# Patient Record
Sex: Female | Born: 1990 | Hispanic: No | Marital: Married | Smoking: Never smoker
Health system: Southern US, Community
[De-identification: ages and names within clinical notes are randomized; demographics above are authoritative.]

## PROBLEM LIST (undated history)

## (undated) ENCOUNTER — Inpatient Hospital Stay (HOSPITAL_COMMUNITY): Payer: Self-pay

## (undated) DIAGNOSIS — E039 Hypothyroidism, unspecified: Secondary | ICD-10-CM

## (undated) HISTORY — PX: NO PAST SURGERIES: SHX2092

## (undated) HISTORY — DX: Hypothyroidism, unspecified: E03.9

---

## 2015-06-13 DIAGNOSIS — Z833 Family history of diabetes mellitus: Secondary | ICD-10-CM | POA: Insufficient documentation

## 2016-01-23 LAB — HM PAP SMEAR

## 2016-07-04 ENCOUNTER — Ambulatory Visit: Payer: Self-pay | Admitting: Family Medicine

## 2016-07-07 ENCOUNTER — Ambulatory Visit (INDEPENDENT_AMBULATORY_CARE_PROVIDER_SITE_OTHER): Payer: BLUE CROSS/BLUE SHIELD | Admitting: Family Medicine

## 2016-07-07 ENCOUNTER — Encounter: Payer: Self-pay | Admitting: Family Medicine

## 2016-07-07 VITALS — BP 104/70 | HR 82 | Temp 98.2°F | Ht 62.0 in | Wt 168.4 lb

## 2016-07-07 DIAGNOSIS — Z6830 Body mass index (BMI) 30.0-30.9, adult: Secondary | ICD-10-CM

## 2016-07-07 DIAGNOSIS — E669 Obesity, unspecified: Secondary | ICD-10-CM

## 2016-07-07 DIAGNOSIS — Z23 Encounter for immunization: Secondary | ICD-10-CM

## 2016-07-07 DIAGNOSIS — Z8639 Personal history of other endocrine, nutritional and metabolic disease: Secondary | ICD-10-CM | POA: Diagnosis not present

## 2016-07-07 DIAGNOSIS — N939 Abnormal uterine and vaginal bleeding, unspecified: Secondary | ICD-10-CM | POA: Diagnosis not present

## 2016-07-07 LAB — COMPREHENSIVE METABOLIC PANEL
ALT: 37 U/L — ABNORMAL HIGH (ref 0–35)
AST: 29 U/L (ref 0–37)
Albumin: 4.1 g/dL (ref 3.5–5.2)
Alkaline Phosphatase: 116 U/L (ref 39–117)
BUN: 11 mg/dL (ref 6–23)
CHLORIDE: 105 meq/L (ref 96–112)
CO2: 25 meq/L (ref 19–32)
Calcium: 9.9 mg/dL (ref 8.4–10.5)
Creatinine, Ser: 0.58 mg/dL (ref 0.40–1.20)
GFR: 133.07 mL/min (ref >60.00)
GLUCOSE: 88 mg/dL (ref 70–99)
POTASSIUM: 3.9 meq/L (ref 3.5–5.1)
Sodium: 137 mEq/L (ref 135–145)
Total Bilirubin: 0.6 mg/dL (ref 0.2–1.2)
Total Protein: 8.5 g/dL — ABNORMAL HIGH (ref 6.0–8.3)

## 2016-07-07 LAB — TSH: TSH: 23.33 u[IU]/mL — ABNORMAL HIGH (ref 0.35–4.50)

## 2016-07-07 LAB — T4, FREE: Free T4: 0.43 ng/dL — ABNORMAL LOW (ref 0.60–1.60)

## 2016-07-07 LAB — HEMOGLOBIN A1C: Hgb A1c MFr Bld: 5.3 % (ref 4.6–6.5)

## 2016-07-07 NOTE — Patient Instructions (Addendum)
We will contact you regarding your lab work in the next 1-2 days.

## 2016-07-07 NOTE — Progress Notes (Signed)
Chief Complaint  Patient presents with  . Establish Care    check tsh       New Patient Visit SUBJECTIVE: HPI: Amy Wagner is an 26 y.o.female who is being seen for establishing care.   Pt moved from [place]. I see her husband.  Had child in 12/2015, was apparently on thyroid medication during pregnancy. Did not continue to take it. Has gained around 20 lbs. No fatigue, skin changes, swelling, hyper/hypodefecation, palpitations, masses in neck, heat/cold intolerance or urinary complaints. She does admit to spotting over the past 25 days. This is not typical for her.   No Known Allergies  Past Medical History:  Diagnosis Date  . No known health problems    Past Surgical History:  Procedure Laterality Date  . NO PAST SURGERIES     Social History   Social History  . Marital status: Married   Social History Main Topics  . Smoking status: Never Smoker  . Smokeless tobacco: Never Used  . Alcohol use No  . Drug use: No   History reviewed. No pertinent family history.   Current Outpatient Prescriptions:  .  Prenatal Multivit-Min-Fe-FA (PRENATAL 1 + IRON PO), Take 1 tablet by mouth daily., Disp: , Rfl:   Patient's last menstrual period was 06/16/2016.  ROS Cardiovascular: Denies palpitations  GI: Denies diarrhea   OBJECTIVE: BP 104/70 (BP Location: Left Arm, Patient Position: Sitting, Cuff Size: Small)   Pulse 82   Temp 98.2 F (36.8 C) (Oral)   Ht 5\' 2"  (1.575 m)   Wt 168 lb 6.4 oz (76.4 kg)   LMP 06/16/2016   SpO2 98%   BMI 30.80 kg/m   Constitutional: -  VS reviewed -  Well developed, well nourished, appears stated age -  No apparent distress  Psychiatric: -  Oriented to person, place, and time -  Memory intact -  Affect and mood normal -  Fluent conversation, good eye contact -  Judgment and insight age appropriate  Eye: -  Conjunctivae clear, no discharge -  Pupils symmetric, round, reactive to light  ENMT: -  Ears are patent b/l without erythema or  discharge. TM's are shiny and clear b/l without evidence of effusion or infection. -  Oral mucosa without lesions, tongue and uvula midline    Tonsils not enlarged, no erythema, no exudate, trachea midline    Pharynx moist, no lesions, no erythema  Neck: -  No gross swelling, no palpable masses -  Thyroid midline, not enlarged, mobile, no palpable masses  Cardiovascular: -  RRR, no murmurs -  No LE edema  Respiratory: -  Normal respiratory effort, no accessory muscle use, no retraction -  Breath sounds equal, no wheezes, no ronchi, no crackles  Gastrointestinal: -  Bowel sounds normal -  No tenderness, no distention, no guarding, no masses  Musculoskeletal: -  No clubbing, no cyanosis -  Gait normal  Skin: -  No significant lesion on inspection -  Warm and dry to palpation   ASSESSMENT/PLAN: History of hypothyroidism - Plan: TSH, T4, free  Abnormal uterine bleeding (AUB) - Plan: TSH  Class 1 obesity without serious comorbidity with body mass index (BMI) of 30.0 to 30.9 in adult, unspecified obesity type - Plan: Hemoglobin A1c, Comprehensive metabolic panel  Encounter for immunization - Plan: Flu Vaccine QUAD 36+ mos IM  Patient instructed to sign release of records form from her previous PCP. For GYN issue, will make sure it is not thyroid related. If it is not, will  give her the option for dedicated visit with us to discuss this issue, or f/u with her GYN for this. Patient should return pending the above. The patient voiced understanding and agreement to the plan.   Jilda Rocheicholas Paul [place], DO 07/07/16  11:39 AM

## 2016-07-07 NOTE — Progress Notes (Signed)
Pre visit review using our clinic review tool, if applicable. No additional management support is needed unless otherwise documented below in the visit note. 

## 2016-07-08 ENCOUNTER — Telehealth: Payer: Self-pay | Admitting: Family Medicine

## 2016-07-08 MED ORDER — LEVOTHYROXINE SODIUM 25 MCG PO CAPS: 1.0000 | ORAL_CAPSULE | Freq: Every day | ORAL | 1 refills | Status: DC

## 2016-07-08 MED ORDER — LEVOTHYROXINE SODIUM 25 MCG PO TABS: 25.0000 ug | ORAL_TABLET | Freq: Every day | ORAL | 1 refills | Status: DC

## 2016-07-08 NOTE — Telephone Encounter (Signed)
Rx for levothyroxine tablets has been sent to pharmacy. TL/CMA

## 2016-07-08 NOTE — Addendum Note (Signed)
Addended by: Kandace BlitzLONG, Jorel Gravlin M on: 07/08/2016 07:58 AM   Modules accepted: Orders

## 2016-07-08 NOTE — Telephone Encounter (Signed)
Pharmacy called in to see if they are able to switch pt Rx from capsules to tablets due to insurance? She says that her medicare will not pay for capsules   CB: [phone]- Merla RichesMary Grace

## 2016-07-10 ENCOUNTER — Encounter: Payer: Self-pay | Admitting: Family Medicine

## 2016-07-11 NOTE — Telephone Encounter (Signed)
I am not sure how to release her labs so she can see it via MyChart...namely her TSH and Free T4. TY.

## 2016-08-20 ENCOUNTER — Ambulatory Visit: Payer: BLUE CROSS/BLUE SHIELD | Admitting: Family Medicine

## 2016-09-03 ENCOUNTER — Other Ambulatory Visit: Payer: Self-pay | Admitting: Family Medicine

## 2016-09-08 NOTE — Telephone Encounter (Signed)
Rx faxed to the pharmacy.  However only given #30.  The patient needs further evaluation and/or laboratory testing before further refills are given.  Asked pharmacy to inform the patient they will need to make an appointment for this.   Confirmation received.//AB/CMA

## 2016-09-22 ENCOUNTER — Ambulatory Visit (INDEPENDENT_AMBULATORY_CARE_PROVIDER_SITE_OTHER): Payer: Self-pay | Admitting: Family Medicine

## 2016-09-22 ENCOUNTER — Encounter: Payer: Self-pay | Admitting: Family Medicine

## 2016-09-22 VITALS — BP 102/62 | HR 84 | Temp 97.7°F | Ht 62.0 in | Wt 163.2 lb

## 2016-09-22 DIAGNOSIS — E039 Hypothyroidism, unspecified: Secondary | ICD-10-CM

## 2016-09-22 DIAGNOSIS — R740 Nonspecific elevation of levels of transaminase and lactic acid dehydrogenase [LDH]: Secondary | ICD-10-CM

## 2016-09-22 DIAGNOSIS — R7401 Elevation of levels of liver transaminase levels: Secondary | ICD-10-CM

## 2016-09-22 DIAGNOSIS — N939 Abnormal uterine and vaginal bleeding, unspecified: Secondary | ICD-10-CM

## 2016-09-22 LAB — TSH: TSH: 13.43 u[IU]/mL — ABNORMAL HIGH (ref 0.35–4.50)

## 2016-09-22 NOTE — Progress Notes (Signed)
Pre visit review using our clinic review tool, if applicable. No additional management support is needed unless otherwise documented below in the visit note. 

## 2016-09-22 NOTE — Patient Instructions (Signed)
Call me when your insurance kicks in.  If things look good, I will see you in 3 mo.  Aleve is available over the counter. Take 2 tabs twice daily. If things with cycle do not improve, call your GYN. This may be related to thyroid also.

## 2016-09-22 NOTE — Progress Notes (Signed)
Chief Complaint  Patient presents with  . Follow-up    on thyroid    Subjective: Here with husband, Dayna BarkerRamesh. They did not f/u as initially recommended due to loss of insurance. Hypothyroidism Patient presents for follow-up of hypothyroidism.  Reports compliance with medication- recently started on levothyroxine 25 mcg daily. Current symptoms include: weight loss - lost 5 lbs Denies: fatigue, weight gain, feeling cold and cold intolerance, constipation, swelling, losing hair, anxiousness, palpitations, voice changes, ST, trouble swallowing, and sweating She believes her dose should be unchanged  Pt also had elevated ALT on initial labs. She takes no medications routinely.  Vaginal bleeding Over the past 2-3 days, the patient has had heavier vaginal bleeding without pain. She is passing some clots. She is compliant with Thyroid medication. No urinary complaints, fevers, abdominal pain, or reports of injury. She has not let her GYN know of her issues yet.  ROS: Heart: Denies palpitations GU: as noted in HPI   Family History  Problem Relation Age of Onset  . Cancer Neg Hx    Past Medical History:  Diagnosis Date  . No known health problems    No Known Allergies  Current Outpatient Prescriptions:  .  levothyroxine (SYNTHROID, LEVOTHROID) 25 MCG tablet, TAKE ONE TABLET BY MOUTH ONCE DAILY BEFORE BREAKFAST, Disp: 30 tablet, Rfl: 0 .  Prenatal Multivit-Min-Fe-FA (PRENATAL 1 + IRON PO), Take 1 tablet by mouth daily., Disp: , Rfl:   Objective: BP 102/62 (BP Location: Right Arm, Patient Position: Sitting, Cuff Size: Normal)   Pulse 84   Temp 97.7 F (36.5 C) (Oral)   Ht 5\' 2"  (1.575 m)   Wt 163 lb 3.2 oz (74 kg)   LMP 08/25/2016 (Approximate)   SpO2 99%   BMI 29.85 kg/m  General: Awake, appears stated age Neck: +thyromegaly,  Skin: No lesions on exposed skin surfaces Heart: RRR, no murmurs, no LE edema Lungs: CTAB, no rales, wheezes or rhonchi. No accessory muscle use Abd:  Soft, NT, ND, no masses or organomegaly Neuro: 2/4 patellar reflex b/l, no clonus, no cerebellar signs Psych: Age appropriate judgment and insight, normal affect and mood  Assessment and Plan: Hypothyroidism, unspecified type - Plan: TSH, CANCELED: T4, free  Elevated ALT measurement - Plan: CANCELED: Ferritin, CANCELED: IBC panel, CANCELED: Hepatitis C antibody, CANCELED: CBC, CANCELED: Hepatitis B Surface AntiGEN  Abnormal uterine bleeding (AUB)  Orders as above. Keep on same dose for now. Will hold off on cancelled labs until insurance kicks in. Aleve for bleeding, if worsening and thyroid WNL, should contact GYN if no improvement. F/u in 3 mo pending above. The patient voiced understanding and agreement to the plan.  Jilda Rocheicholas Paul [place], DO 09/22/16  10:42 AM

## 2016-09-23 ENCOUNTER — Other Ambulatory Visit: Payer: Self-pay | Admitting: Family Medicine

## 2016-09-23 MED ORDER — LEVOTHYROXINE SODIUM 25 MCG PO TABS: 37.5000 ug | ORAL_TABLET | Freq: Every day | ORAL | 1 refills | Status: DC

## 2016-09-29 ENCOUNTER — Other Ambulatory Visit: Payer: Self-pay | Admitting: Family Medicine

## 2016-09-30 ENCOUNTER — Encounter: Payer: Self-pay | Admitting: Family Medicine

## 2016-09-30 MED ORDER — LEVOTHYROXINE SODIUM 25 MCG PO TABS: 25.0000 ug | ORAL_TABLET | Freq: Every day | ORAL | 0 refills | Status: DC

## 2016-12-02 ENCOUNTER — Encounter: Payer: Self-pay | Admitting: Family Medicine

## 2016-12-04 ENCOUNTER — Encounter: Payer: Self-pay | Admitting: Family Medicine

## 2016-12-04 MED ORDER — LEVOTHYROXINE SODIUM 25 MCG PO TABS: 25.0000 ug | ORAL_TABLET | Freq: Every day | ORAL | 0 refills | Status: DC

## 2016-12-24 ENCOUNTER — Ambulatory Visit: Payer: Self-pay | Admitting: Family Medicine

## 2017-01-19 ENCOUNTER — Encounter: Payer: Self-pay | Admitting: Family Medicine

## 2017-01-19 ENCOUNTER — Ambulatory Visit (INDEPENDENT_AMBULATORY_CARE_PROVIDER_SITE_OTHER): Payer: Self-pay | Admitting: Family Medicine

## 2017-01-19 ENCOUNTER — Other Ambulatory Visit: Payer: Self-pay | Admitting: Family Medicine

## 2017-01-19 VITALS — BP 100/60 | HR 69 | Temp 98.5°F | Ht 62.0 in | Wt 161.2 lb

## 2017-01-19 DIAGNOSIS — N926 Irregular menstruation, unspecified: Secondary | ICD-10-CM

## 2017-01-19 DIAGNOSIS — E039 Hypothyroidism, unspecified: Secondary | ICD-10-CM

## 2017-01-19 DIAGNOSIS — Z3201 Encounter for pregnancy test, result positive: Secondary | ICD-10-CM

## 2017-01-19 LAB — T4, FREE: Free T4: 0.76 ng/dL (ref 0.60–1.60)

## 2017-01-19 LAB — POCT URINE PREGNANCY: Preg Test, Ur: POSITIVE — AB

## 2017-01-19 LAB — TSH: TSH: 4.51 u[IU]/mL — AB (ref 0.35–4.50)

## 2017-01-19 MED ORDER — LEVOTHYROXINE SODIUM 25 MCG PO TABS: ORAL_TABLET | ORAL | 1 refills | Status: DC

## 2017-01-19 NOTE — Patient Instructions (Signed)
I will let you know exactly what to do on MyChart based on your labs.  We will refer you to an OB that accepts your insurance.  Start taking a pre-natal vitamin.

## 2017-01-19 NOTE — Progress Notes (Signed)
Synthroid refilled.

## 2017-01-19 NOTE — Progress Notes (Signed)
Chief Complaint  Patient presents with  . Follow-up    on thyroid    Subjective: Hypothyroidism Patient presents for follow-up of hypothyroidism.  Reports compliance with medication. Current symptoms include: none Denies: fatigue, weight changes, feeling cold and cold intolerance, constipation, swelling, feeling slow, losing hair, anxiousness, feeling excessive energy, palpitations and sweating She believes her dose should be unchanged pending her labs  ROS: Heart: Denies chest pain  Lungs: Denies SOB   Family History  Problem Relation Age of Onset  . Cancer Neg Hx    Past Medical History:  Diagnosis Date  . No known health problems    No Known Allergies  Current Outpatient Prescriptions:  .  levothyroxine (SYNTHROID, LEVOTHROID) 25 MCG tablet, Take 1 tablet (25 mcg total) by mouth daily. (Patient taking differently: Take 1 1/2 tablet by mouth daily.), Disp: 90 tablet, Rfl: 0 .  Prenatal Multivit-Min-Fe-FA (PRENATAL 1 + IRON PO), Take 1 tablet by mouth daily., Disp: , Rfl:   Objective: BP 100/60 (BP Location: Left Arm, Patient Position: Sitting, Cuff Size: Normal)   Pulse 69   Temp 98.5 F (36.9 C) (Oral)   Ht 5\' 2"  (1.575 m)   Wt 161 lb 3.2 oz (73.1 kg)   LMP 11/28/2016 (Approximate) Comment: Pt really not sure of the date  SpO2 98%   BMI 29.48 kg/m  General: Awake, appears stated age HEENT: MMM, EOMi Heart: RRR, no murmurs, no LE edema Lungs: CTAB, no rales, wheezes or rhonchi. No accessory muscle use Abd: BS+, soft, NT, ND , no masses or organomegaly Neck: Supple, symmetric, no nodules on thyroid or thyromegaly Psych: Age appropriate judgment and insight, normal affect and mood  Assessment and Plan: Hypothyroidism, unspecified type - Plan: TSH, T4, free  Missed period - Plan: POCT urine pregnancy  Positive pregnancy test - Plan: Ambulatory referral to Obstetrics / Gynecology  Orders as above. She has been difficult to monitor due to poor follow-up  secondary to no insurance coverage. She is improving, however I will order a free T4 and TSH. Continue on current dose until I get the results back. She had a home positive pregnancy test. Verified today. Will refer to OB. The patient voiced understanding and agreement to the plan.  Jilda Rocheicholas Paul [place], DO 01/19/17  12:00 PM

## 2017-01-20 ENCOUNTER — Encounter: Payer: Self-pay | Admitting: Family Medicine

## 2017-01-29 ENCOUNTER — Encounter: Payer: Self-pay | Admitting: Family Medicine

## 2017-02-24 ENCOUNTER — Encounter: Payer: Self-pay | Admitting: Certified Nurse Midwife

## 2017-02-26 ENCOUNTER — Other Ambulatory Visit (HOSPITAL_COMMUNITY)
Admission: RE | Admit: 2017-02-26 | Discharge: 2017-02-26 | Disposition: A | Discharge status: Home / Self Care | Payer: Medicaid Other | Source: Ambulatory Visit | Attending: Student | Admitting: Student

## 2017-02-26 ENCOUNTER — Encounter: Payer: Self-pay | Admitting: Student

## 2017-02-26 ENCOUNTER — Ambulatory Visit (INDEPENDENT_AMBULATORY_CARE_PROVIDER_SITE_OTHER): Payer: Medicaid Other | Admitting: Student

## 2017-02-26 DIAGNOSIS — Z3481 Encounter for supervision of other normal pregnancy, first trimester: Secondary | ICD-10-CM

## 2017-02-26 DIAGNOSIS — Z3490 Encounter for supervision of normal pregnancy, unspecified, unspecified trimester: Secondary | ICD-10-CM | POA: Insufficient documentation

## 2017-02-26 DIAGNOSIS — E039 Hypothyroidism, unspecified: Secondary | ICD-10-CM

## 2017-02-26 MED ORDER — LEVOTHYROXINE SODIUM 25 MCG PO TABS: ORAL_TABLET | ORAL | 1 refills | Status: DC

## 2017-02-26 NOTE — Progress Notes (Signed)
  Subjective:    Amy DeedsSharmila Poudel is being seen today for her first obstetrical visit.  This is a planned pregnancy. She is at 4345w6d gestation. Her obstetrical history is significant for hypothyroidism. Relationship with FOB: spouse, living together. Patient does intend to breast feed. Pregnancy history fully reviewed.  Patient reports no complaints.  Review of Systems:   Review of Systems  Constitutional: Negative.   Respiratory: Negative.   Cardiovascular: Negative.   Gastrointestinal: Negative.   Genitourinary: Negative.   Musculoskeletal: Negative.   Psychiatric/Behavioral: Negative.     Objective:     BP 114/74   Pulse 93   Wt 156 lb (70.8 kg)   LMP 11/28/2016 (Approximate) Comment: Pt really not sure of the date  BMI 28.53 kg/m  Physical Exam  Constitutional: She is oriented to person, place, and time. She appears well-developed.  HENT:  Head: Normocephalic.  Neck: Normal range of motion.  Respiratory: Effort normal.  GI: Soft.  Musculoskeletal: Normal range of motion.  Neurological: She is alert and oriented to person, place, and time. She has normal reflexes.  Skin: Skin is warm and dry.  Psychiatric: She has a normal mood and affect.    Exam    Assessment:    Pregnancy: G2P1001 Patient Active Problem List   Diagnosis Date Noted  . Supervision of normal pregnancy in first trimester 02/26/2017  . Hypothyroid 09/22/2016       Plan:     Initial labs drawn. Prenatal vitamins. Problem list reviewed and updated. Role of ultrasound in pregnancy discussed; fetal survey: planning. Amniocentesis discussed: not indicated. Follow up in 4 weeks. 50% of 30 min visit spent on counseling and coordination of care.  -TSH today -Nuchal translucecy ordered today -continue synthroid 37.5 mcg  Charlesetta GaribaldiKathryn Lorraine Kooistra CNM 02/26/2017

## 2017-02-26 NOTE — Patient Instructions (Signed)

## 2017-02-26 NOTE — Addendum Note (Signed)
Addended by: Cheree DittoGRAHAM, Christinia Lambeth A on: 02/26/2017 03:15 PM   Modules accepted: Orders

## 2017-02-27 LAB — OBSTETRIC PANEL, INCLUDING HIV
Antibody Screen: NEGATIVE
BASOS ABS: 0 10*3/uL (ref 0.0–0.2)
BASOS: 0 %
EOS (ABSOLUTE): 0.1 10*3/uL (ref 0.0–0.4)
Eos: 1 %
HEMATOCRIT: 40.2 % (ref 34.0–46.6)
HIV Screen 4th Generation wRfx: NONREACTIVE
Hemoglobin: 13.3 g/dL (ref 11.1–15.9)
Hepatitis B Surface Ag: NEGATIVE
IMMATURE GRANS (ABS): 0 10*3/uL (ref 0.0–0.1)
IMMATURE GRANULOCYTES: 0 %
LYMPHS: 31 %
Lymphocytes Absolute: 4 10*3/uL — ABNORMAL HIGH (ref 0.7–3.1)
MCH: 28 pg (ref 26.6–33.0)
MCHC: 33.1 g/dL (ref 31.5–35.7)
MCV: 85 fL (ref 79–97)
MONOCYTES: 5 %
Monocytes Absolute: 0.6 10*3/uL (ref 0.1–0.9)
NEUTROS PCT: 63 %
Neutrophils Absolute: 8 10*3/uL — ABNORMAL HIGH (ref 1.4–7.0)
PLATELETS: 261 10*3/uL (ref 150–379)
RBC: 4.75 x10E6/uL (ref 3.77–5.28)
RDW: 15.2 % (ref 12.3–15.4)
RPR Ser Ql: NONREACTIVE
RUBELLA: 2.76 {index} (ref >0.99)
Rh Factor: POSITIVE
WBC: 12.7 10*3/uL — ABNORMAL HIGH (ref 3.4–10.8)

## 2017-02-27 LAB — TSH: TSH: 2.02 u[IU]/mL (ref 0.450–4.500)

## 2017-02-27 LAB — GC/CHLAMYDIA PROBE AMP (~~LOC~~) NOT AT ARMC
CHLAMYDIA, DNA PROBE: NEGATIVE
NEISSERIA GONORRHEA: NEGATIVE

## 2017-02-28 LAB — URINE CULTURE

## 2017-03-02 ENCOUNTER — Encounter: Payer: Self-pay | Admitting: Student

## 2017-03-03 ENCOUNTER — Encounter: Payer: Self-pay | Admitting: General Practice

## 2017-03-03 ENCOUNTER — Encounter: Payer: Self-pay | Admitting: Student

## 2017-03-03 LAB — HEMOGLOBINOPATHY EVALUATION
Ferritin: 46 ng/mL (ref 15–150)
HEMOGLOBIN: 13.2 g/dL (ref 11.1–15.9)
Hematocrit: 39.5 % (ref 34.0–46.6)
Hgb A2 Quant: 2.3 % (ref 1.8–3.2)
Hgb A: 97.7 % (ref 96.4–98.8)
Hgb C: 0 %
Hgb F Quant: 0 % (ref 0.0–2.0)
Hgb S: 0 %
Hgb Solubility: NEGATIVE
Hgb Variant: 0 %
MCH: 28.4 pg (ref 26.6–33.0)
MCHC: 33.4 g/dL (ref 31.5–35.7)
MCV: 85 fL (ref 79–97)
Platelets: 262 10*3/uL (ref 150–379)
RBC: 4.65 x10E6/uL (ref 3.77–5.28)
RDW: 14.6 % (ref 12.3–15.4)
WBC: 13.5 10*3/uL — ABNORMAL HIGH (ref 3.4–10.8)

## 2017-03-05 ENCOUNTER — Other Ambulatory Visit: Payer: Self-pay | Admitting: Student

## 2017-03-21 LAB — SPECIMEN STATUS REPORT

## 2017-03-21 LAB — HGB A1C W/O EAG: Hgb A1c MFr Bld: 5.1 % (ref 4.8–5.6)

## 2017-03-26 ENCOUNTER — Ambulatory Visit (INDEPENDENT_AMBULATORY_CARE_PROVIDER_SITE_OTHER): Payer: Medicaid Other | Admitting: Certified Nurse Midwife

## 2017-03-26 VITALS — BP 121/87 | HR 112 | Wt 157.0 lb

## 2017-03-26 DIAGNOSIS — O99282 Endocrine, nutritional and metabolic diseases complicating pregnancy, second trimester: Secondary | ICD-10-CM | POA: Diagnosis present

## 2017-03-26 DIAGNOSIS — Z23 Encounter for immunization: Secondary | ICD-10-CM

## 2017-03-26 DIAGNOSIS — E039 Hypothyroidism, unspecified: Secondary | ICD-10-CM

## 2017-03-26 DIAGNOSIS — Z3481 Encounter for supervision of other normal pregnancy, first trimester: Secondary | ICD-10-CM

## 2017-03-26 DIAGNOSIS — N644 Mastodynia: Secondary | ICD-10-CM

## 2017-03-26 NOTE — Progress Notes (Signed)
Pt has sore area to right breast

## 2017-03-26 NOTE — Patient Instructions (Signed)

## 2017-03-26 NOTE — Progress Notes (Signed)
Subjective:  Amy Wagner is a 26 y.o. G2P1001 at [redacted]w[redacted]d being seen today for ongoing prenatal care.  She is currently monitored for the following issues for this low-risk pregnancy and has Hypothyroid and Supervision of normal pregnancy in first trimester on her problem list.  Patient reports Pain in right nipple. Still breastfeeding her toddler. No lump or redness..   Lockie Pares. Bleeding: None.  Movement: Absent. Denies leaking of fluid.   The following portions of the patient's history were reviewed and updated as appropriate: allergies, current medications, past family history, past medical history, past social history, past surgical history and problem list. Problem list updated.  Objective:   Vitals:   03/26/17 0953  BP: 121/87  Pulse: (!) 112  Weight: 157 lb (71.2 kg)    Fetal Status: Fetal Heart Rate (bpm): 148 Fundal Height: 16 cm Movement: Absent     General:  Alert, oriented and cooperative. Patient is in no acute distress.  Skin: Skin is warm and dry. No rash noted.   Cardiovascular: Normal heart rate noted  Respiratory: Normal respiratory effort, no problems with respiration noted  Abdomen: Soft, gravid, appropriate for gestational age. Pain/Pressure: Absent     Pelvic: Vag. Bleeding: None     Cervical exam deferred        Extremities: Normal range of motion.  Edema: None  Mental Status: Normal mood and affect. Normal behavior. Normal judgment and thought content.  Breasts: left breast normal without mass, skin or nipple changes. Right breast normal. Right nipple: no edema, erythema, or drainage. Small area at 12 o'clock cracked.  Urinalysis:      Assessment and Plan:  Pregnancy: G2P1001 at [redacted]w[redacted]d  1. Hypothyroidism affecting pregnancy in second trimester - TSH  2. Encounter for supervision of other normal pregnancy in first trimester - Culture, OB Urine - [place] MFM OB COMP + 14 WK; Future  3. Need for immunization against influenza - Flu Vaccine QUAD 6+ mos IM  (Fluarix) - AFP/Quad Scr  4. Nipple pain - no sign of infection or yeast - likely caused by trauma - recommend warm compresses, coconut oil, and no nursing on that side  Preterm labor symptoms and general obstetric precautions including but not limited to vaginal bleeding, contractions, leaking of fluid and fetal movement were reviewed in detail with the patient. Please refer to After Visit Summary for other counseling recommendations.  Return in about 4 weeks (around 04/23/2017).   Donette Larry, CNM

## 2017-03-27 LAB — AFP TUMOR MARKER: AFP, SERUM, TUMOR MARKER: 17.7 ng/mL — AB (ref 0.0–8.3)

## 2017-03-27 LAB — TSH: TSH: 3.13 u[IU]/mL (ref 0.450–4.500)

## 2017-03-28 LAB — URINE CULTURE, OB REFLEX

## 2017-03-28 LAB — CULTURE, OB URINE

## 2017-04-01 ENCOUNTER — Encounter (HOSPITAL_COMMUNITY): Payer: Self-pay | Admitting: Certified Nurse Midwife

## 2017-04-02 ENCOUNTER — Telehealth: Payer: Self-pay | Admitting: Certified Nurse Midwife

## 2017-04-02 NOTE — Telephone Encounter (Signed)
Patient husband called he have questions about his wife test results

## 2017-04-07 NOTE — Telephone Encounter (Signed)
Spoke with patient's husband. He was concerned about elevated AFP level. Upon checking on result, noted that the incorrect lab was ordered. Explained this to him and told him that this test could be repeated at her next visit on 10/26. Understanding voiced.

## 2017-04-08 ENCOUNTER — Inpatient Hospital Stay (HOSPITAL_COMMUNITY)
Admission: AD | Admit: 2017-04-08 | Discharge: 2017-04-08 | Disposition: A | Discharge status: Home / Self Care | Payer: Medicaid Other | Source: Ambulatory Visit | Attending: Family Medicine | Admitting: Family Medicine

## 2017-04-08 DIAGNOSIS — X58XXXA Exposure to other specified factors, initial encounter: Secondary | ICD-10-CM | POA: Insufficient documentation

## 2017-04-08 DIAGNOSIS — Z3A18 18 weeks gestation of pregnancy: Secondary | ICD-10-CM | POA: Insufficient documentation

## 2017-04-08 DIAGNOSIS — S025XXA Fracture of tooth (traumatic), initial encounter for closed fracture: Secondary | ICD-10-CM | POA: Insufficient documentation

## 2017-04-08 DIAGNOSIS — O9A212 Injury, poisoning and certain other consequences of external causes complicating pregnancy, second trimester: Secondary | ICD-10-CM | POA: Diagnosis not present

## 2017-04-08 DIAGNOSIS — K0889 Other specified disorders of teeth and supporting structures: Secondary | ICD-10-CM

## 2017-04-08 DIAGNOSIS — Z331 Pregnant state, incidental: Secondary | ICD-10-CM | POA: Diagnosis not present

## 2017-04-08 MED ORDER — ACETAMINOPHEN 500 MG PO TABS
1000.0000 mg | ORAL_TABLET | Freq: Once | ORAL | Status: AC
  Administered 2017-04-08: 1000 mg via ORAL
  Filled 2017-04-08: qty 2

## 2017-04-08 NOTE — MAU Note (Signed)
Pt c/o severe tooth pain that started yesterday morning, but it got worse throughout the night. States she is unable to get into dentist. Pt states she has a broken tooth that broke several days ago.

## 2017-04-08 NOTE — Discharge Instructions (Signed)
Dentists in [facility] [place]  [address]  [phone]   [facility]  [address] [place]  [phone]   Elson Areas DDS  [address]  [phone]   Irving Burton DDS  [address]  [phone]   [facility]   [address]  [phone]   [facility]  [address] [place] [address]  [phone]  [facility]  [address]  [phone]   [facility]  [address] [place] [address]  [phone]   [facility]  [address]  [phone]   Druscilla Brownie A. Jon Billings, DDS PA  [address]  [phone]   [facility] F[facility]  [address]  [phone]   Dr. Merilyn Baba, DDS  [address]  [phone]   Talmage Coin DDS  [address] [place]  [phone]   Dr. Anne Ng, DDS  No reviews  Dentist  [address] [place]  [phone]   Dr. Meda Coffee. Arlyce Dice, DDS  [address]  [phone]   Dr. Georga Hacking. Lutins, DDS   Dentist  [address]  [phone]   Lorenza Burton I DDS  [address]  [phone]  Dr. Sherrlyn Hock  Dentist  [address] [place]  [phone]

## 2017-04-08 NOTE — MAU Provider Note (Signed)
  History     CSN: [id number]  Arrival date and time: 04/08/17 0455  HPI: Amy Wagner is a 26 y.o. G2P1001 with IUP at [redacted]w[redacted]d who presents with c/c of broken tooth. She reports that her tooth broke/chipped off about a week ago, this is causing her some pain, which is worse today. She has not taken anything at home for the pain. She denies any fever, chills, N/V, or any other symptoms. Denies any pregnancy problems, vaginal bleeding, vaginal discharge, or urinary symptoms.  Past obstetric history: OB History  Gravida Para Term Preterm AB Living  SAB TAB Ectopic Multiple Live Births          1    # Outcome Date GA Lbr Len/2nd Weight Sex Delivery Anes PTL Lv  2 Current           1 Term 01/25/16   6 lb (2.722 kg) F Vag-Spont  N LIV      Past Medical History:  Diagnosis Date  . No known health problems     Past Surgical History:  Procedure Laterality Date  . NO PAST SURGERIES      Family History  Problem Relation Age of Onset  . Diabetes Mother   . Hypertension Mother   . Cancer Neg Hx     Social History  Substance Use Topics  . Smoking status: Never Smoker  . Smokeless tobacco: Never Used  . Alcohol use No    Allergies: No Known Allergies  Prescriptions Prior to Admission  Medication Sig Dispense Refill Last Dose  . levothyroxine (SYNTHROID, LEVOTHROID) 25 MCG tablet Take 1 1/2 tablet by mouth daily. 135 tablet 1 Taking  . Prenatal Multivit-Min-Fe-FA (PRENATAL 1 + IRON PO) Take 1 tablet by mouth daily.   Taking    Review of Systems - Negative except for what is mentioned in HPI.  Physical Exam   Blood pressure 112/71, pulse 79, temperature 97.8 F (36.6 C), resp. rate 16, height  (1.575 m), weight 164 lb (74.4 kg), last menstrual period 11/28/2016, SpO2 100 %.  Constitutional: Well-developed, well-nourished female in no acute distress.  HENT: [place]/AT, normal oropharynx mucosa. R molar tooth noted to be broke at top; no drainage; no gingival  erythema or swelling. No TTP.  Eyes: normal conjunctivae, no scleral icterus Cardiovascular: normal rate Respiratory: normal effort MSK: no edema, normal ROM Neurologic: Alert and oriented x 4. Psych: Normal mood and affect Skin: warm and dry   FHT:  156  MAU Course  Procedures  MDM Pt seen and examined. VS reviewed.  Tylenol 1,000 gram given with some relief. Patient does not want anything stronger for pain and had not tried prior treatment at home. No signs or symptoms of infection. Recommended follow up with dentist as early as today.   Assessment and Plan  Assessment: 1. Closed fracture of tooth, initial encounter   2. Tooth pain     Plan: --Discharge home in stable condition with follow up with dentist --List of dentists in the area given --Letter for dental care given.     Degele, Kandra Nicolas, MD 04/08/2017 5:51 AM

## 2017-04-09 ENCOUNTER — Encounter: Payer: Self-pay | Admitting: Advanced Practice Midwife

## 2017-04-09 ENCOUNTER — Ambulatory Visit (HOSPITAL_COMMUNITY)
Admission: RE | Admit: 2017-04-09 | Discharge: 2017-04-09 | Disposition: A | Discharge status: Home / Self Care | Payer: Medicaid Other | Source: Ambulatory Visit | Attending: Certified Nurse Midwife | Admitting: Certified Nurse Midwife

## 2017-04-09 ENCOUNTER — Other Ambulatory Visit: Payer: Self-pay | Admitting: Certified Nurse Midwife

## 2017-04-09 ENCOUNTER — Other Ambulatory Visit (HOSPITAL_COMMUNITY): Payer: Self-pay | Admitting: *Deleted

## 2017-04-09 DIAGNOSIS — O365920 Maternal care for other known or suspected poor fetal growth, second trimester, not applicable or unspecified: Secondary | ICD-10-CM

## 2017-04-09 DIAGNOSIS — E039 Hypothyroidism, unspecified: Secondary | ICD-10-CM

## 2017-04-09 DIAGNOSIS — Z3689 Encounter for other specified antenatal screening: Secondary | ICD-10-CM

## 2017-04-09 DIAGNOSIS — Z3A17 17 weeks gestation of pregnancy: Secondary | ICD-10-CM | POA: Diagnosis not present

## 2017-04-09 DIAGNOSIS — Z3481 Encounter for supervision of other normal pregnancy, first trimester: Secondary | ICD-10-CM

## 2017-04-09 DIAGNOSIS — O99282 Endocrine, nutritional and metabolic diseases complicating pregnancy, second trimester: Secondary | ICD-10-CM | POA: Insufficient documentation

## 2017-04-21 LAB — AFP TETRA
DIA Value (EIA): 172.77 pg/mL
DSR (By Age)    1 IN: 878
MSAFP: 13.5 ng/mL
MSHCG: 39637 m[IU]/mL
Maternal Age At EDD: 27.7 yr
uE3 Value: 0.5 ng/mL

## 2017-04-21 LAB — SPECIMEN STATUS REPORT

## 2017-04-24 ENCOUNTER — Ambulatory Visit (INDEPENDENT_AMBULATORY_CARE_PROVIDER_SITE_OTHER): Payer: Medicaid Other | Admitting: Advanced Practice Midwife

## 2017-04-24 VITALS — BP 124/75 | HR 98 | Wt 166.3 lb

## 2017-04-24 DIAGNOSIS — O162 Unspecified maternal hypertension, second trimester: Secondary | ICD-10-CM

## 2017-04-24 DIAGNOSIS — Z3400 Encounter for supervision of normal first pregnancy, unspecified trimester: Secondary | ICD-10-CM

## 2017-04-24 DIAGNOSIS — Z3402 Encounter for supervision of normal first pregnancy, second trimester: Secondary | ICD-10-CM

## 2017-04-24 MED ORDER — PRENATAL PLUS 27-1 MG PO TABS: 1.0000 | ORAL_TABLET | Freq: Every day | ORAL | 6 refills | Status: DC

## 2017-04-24 NOTE — Patient Instructions (Addendum)
Hypertension During Pregnancy Hypertension, commonly called high blood pressure, is when the force of blood pumping through your arteries is too strong. Arteries are blood vessels that carry blood from the heart throughout the body. Hypertension during pregnancy can cause problems for you and your baby. Your baby may be born early (prematurely) or may not weigh as much as he or she should at birth. Very bad cases of hypertension during pregnancy can be life-threatening. Different types of hypertension can occur during pregnancy. These include:  Chronic hypertension. This happens when: ? You have hypertension before pregnancy and it continues during pregnancy. ? You develop hypertension before you are [redacted] weeks pregnant, and it continues during pregnancy.  Gestational hypertension. This is hypertension that develops after the 20th week of pregnancy.  Preeclampsia, also called toxemia of pregnancy. This is a very serious type of hypertension that develops only during pregnancy. It affects the whole body, and it can be very dangerous for you and your baby.  Gestational hypertension and preeclampsia usually go away within 6 weeks after your baby is born. Women who have hypertension during pregnancy have a greater chance of developing hypertension later in life or during future pregnancies. What are the causes? The exact cause of hypertension is not known. What increases the risk? There are certain factors that make it more likely for you to develop hypertension during pregnancy. These include:  Having hypertension during a previous pregnancy or prior to pregnancy.  Being overweight.  Being older than age 107.  Being pregnant for the first time or being pregnant with more than one baby.  Becoming pregnant using fertilization methods such as IVF (in vitro fertilization).  Having diabetes, kidney problems, or systemic lupus erythematosus.  Having a family history of hypertension.  What are the  signs or symptoms? Chronic hypertension and gestational hypertension rarely cause symptoms. Preeclampsia causes symptoms, which may include:  Increased protein in your urine. Your health care provider will check for this at every visit before you give birth (prenatal visit).  Severe headaches.  Sudden weight gain.  Swelling of the hands, face, legs, and feet.  Nausea and vomiting.  Vision problems, such as blurred or double vision.  Numbness in the face, arms, legs, and feet.  Dizziness.  Slurred speech.  Sensitivity to bright lights.  Abdominal pain.  Convulsions.  How is this diagnosed? You may be diagnosed with hypertension during a routine prenatal exam. At each prenatal visit, you may:  Have a urine test to check for high amounts of protein in your urine.  Have your blood pressure checked. A blood pressure reading is recorded as two numbers, such as "120 over 80" (or 120/80). The first ("top") number is called the systolic pressure. It is a measure of the pressure in your arteries when your heart beats. The second ("bottom") number is called the diastolic pressure. It is a measure of the pressure in your arteries as your heart relaxes between beats. Blood pressure is measured in a unit called mm Hg. A normal blood pressure reading is: ? Systolic: below 235. ? Diastolic: below 80.  The type of hypertension that you are diagnosed with depends on your test results and when your symptoms developed.  Chronic hypertension is usually diagnosed before 20 weeks of pregnancy.  Gestational hypertension is usually diagnosed after 20 weeks of pregnancy.  Hypertension with high amounts of protein in the urine is diagnosed as preeclampsia.  Blood pressure measurements that stay above 573 systolic, or above 220 diastolic, are  signs of severe preeclampsia.  How is this treated? Treatment for hypertension during pregnancy varies depending on the type of hypertension you have and how  serious it is.  If you take medicines called ACE inhibitors to treat chronic hypertension, you may need to switch medicines. ACE inhibitors should not be taken during pregnancy.  If you have gestational hypertension, you may need to take blood pressure medicine.  If you are at risk for preeclampsia, your health care provider may recommend that you take a low-dose aspirin every day to prevent high blood pressure during your pregnancy.  If you have severe preeclampsia, you may need to be hospitalized so you and your baby can be monitored closely. You may also need to take medicine (magnesium sulfate) to prevent seizures and to lower blood pressure. This medicine may be given as an injection or through an IV tube.  In some cases, if your condition gets worse, you may need to deliver your baby early.  Follow these instructions at home: Eating and drinking  Drink enough fluid to keep your urine clear or pale yellow.  Eat a healthy diet that is low in salt (sodium). Do not add salt to your food. Check food labels to see how much sodium a food or beverage contains. Lifestyle  Do not use any products that contain nicotine or tobacco, such as cigarettes and e-cigarettes. If you need help quitting, ask your health care provider.  Do not use alcohol.  Avoid caffeine.  Avoid stress as much as possible. Rest and get plenty of sleep. General instructions  Take over-the-counter and prescription medicines only as told by your health care provider.  While lying down, lie on your left side. This keeps pressure off your baby.  While sitting or lying down, raise (elevate) your feet. Try putting some pillows under your lower legs.  Exercise regularly. Ask your health care provider what kinds of exercise are best for you.  Keep all prenatal and follow-up visits as told by your health care provider. This is important. Contact a health care provider if:  You have symptoms that your health care  provider told you may require more treatment or monitoring, such as: ? Fever. ? Vomiting. ? Headache. Get help right away if:  You have severe abdominal pain or vomiting that does not get better with treatment.  You suddenly develop swelling in your hands, ankles, or face.  You gain 4 lbs (1.8 kg) or more in 1 week.  You develop vaginal bleeding, or you have blood in your urine.  You do not feel your baby moving as much as usual.  You have blurred or double vision.  You have muscle twitching or sudden tightening (spasms).  You have shortness of breath.  Your lips or fingernails turn blue. This information is not intended to replace advice given to you by your health care provider. Make sure you discuss any questions you have with your health care provider. Document Released: 03/04/2011 Document Revised: 01/04/2016 Document Reviewed: 11/30/2015 Elsevier Interactive Patient Education  2018 [employer]   Second Trimester of Pregnancy The second trimester is from week 13 through week 28, month 4 through 6. This is often the time in pregnancy that you feel your best. Often times, morning sickness has lessened or quit. You may have more energy, and you may get hungry more often. Your unborn baby (fetus) is growing rapidly. At the end of the sixth month, he or she is about 9 inches long and weighs about  1 pounds. You will likely feel the baby move (quickening) between 18 and 20 weeks of pregnancy. Follow these instructions at home:  Avoid all smoking, herbs, and alcohol. Avoid drugs not approved by your doctor.  Do not use any tobacco products, including cigarettes, chewing tobacco, and electronic cigarettes. If you need help quitting, ask your doctor. You may get counseling or other support to help you quit.  Only take medicine as told by your doctor. Some medicines are safe and some are not during pregnancy.  Exercise only as told by your doctor. Stop exercising if you start  having cramps.  Eat regular, healthy meals.  Wear a good support bra if your breasts are tender.  Do not use hot tubs, steam rooms, or saunas.  Wear your seat belt when driving.  Avoid raw meat, uncooked cheese, and liter boxes and soil used by cats.  Take your prenatal vitamins.  Take 1500-2000 milligrams of calcium daily starting at the 20th week of pregnancy until you deliver your baby.  Try taking medicine that helps you poop (stool softener) as needed, and if your doctor approves. Eat more fiber by eating fresh fruit, vegetables, and whole grains. Drink enough fluids to keep your pee (urine) clear or pale yellow.  Take warm water baths (sitz baths) to soothe pain or discomfort caused by hemorrhoids. Use hemorrhoid cream if your doctor approves.  If you have puffy, bulging veins (varicose veins), wear support hose. Raise (elevate) your feet for 15 minutes, 3-4 times a day. Limit salt in your diet.  Avoid heavy lifting, wear low heals, and sit up straight.  Rest with your legs raised if you have leg cramps or low back pain.  Visit your dentist if you have not gone during your pregnancy. Use a soft toothbrush to brush your teeth. Be gentle when you floss.  You can have sex (intercourse) unless your doctor tells you not to.  Go to your doctor visits. Get help if:  You feel dizzy.  You have mild cramps or pressure in your lower belly (abdomen).  You have a nagging pain in your belly area.  You continue to feel sick to your stomach (nauseous), throw up (vomit), or have watery poop (diarrhea).  You have bad smelling fluid coming from your vagina.  You have pain with peeing (urination). Get help right away if:  You have a fever.  You are leaking fluid from your vagina.  You have spotting or bleeding from your vagina.  You have severe belly cramping or pain.  You lose or gain weight rapidly.  You have trouble catching your breath and have chest pain.  You notice  sudden or extreme puffiness (swelling) of your face, hands, ankles, feet, or legs.  You have not felt the baby move in over an hour.  You have severe headaches that do not go away with medicine.  You have vision changes. This information is not intended to replace advice given to you by your health care provider. Make sure you discuss any questions you have with your health care provider. Document Released: 09/10/2009 Document Revised: 11/22/2015 Document Reviewed: 08/17/2012 Elsevier Interactive Patient Education  2017 [employer].

## 2017-04-24 NOTE — Progress Notes (Signed)
   PRENATAL VISIT NOTE  Subjective:  Amy Wagner is a 26 y.o. G2P1001 at 78w0dbeing seen today for ongoing prenatal care.  She is currently monitored for the following issues for this low-risk pregnancy and has Hypothyroid; Supervision of normal pregnancy; and Family history of diabetes mellitus in mother on her problem list.  Patient reports no complaints.  Denies HA, vision changes or epigastric pain. Contractions: Not present. Vag. Bleeding: None.  Movement: Present. Denies leaking of fluid.   The following portions of the patient's history were reviewed and updated as appropriate: allergies, current medications, past family history, past medical history, past social history, past surgical history and problem list. Problem list updated.  Objective:   Vitals:   04/24/17 0951 04/24/17 0957  BP: (!) 130/97 124/75  Pulse: (!) 127 98  Weight: 166 lb 4.8 oz (75.4 kg)   BP 124/75  Fetal Status: Fetal Heart Rate (bpm): 162   Movement: Present     General:  Alert, oriented and cooperative. Patient is in no acute distress.  Skin: Skin is warm and dry. No rash noted.   Cardiovascular: Normal heart rate noted  Respiratory: Normal respiratory effort, no problems with respiration noted  Abdomen: Soft, gravid, appropriate for gestational age.  Pain/Pressure: Absent     Pelvic: Cervical exam deferred        Extremities: Normal range of motion.  Edema: None  Mental Status:  Normal mood and affect. Normal behavior. Normal judgment and thought content.   Assessment and Plan:  Pregnancy: G2P1001 at 250w0d1. Supervision of normal first pregnancy, antepartum  - prenatal vitamin w/FE, FA (PRENATAL 1 + 1) 27-1 MG TABS tablet; Take 1 tablet by mouth daily at 12 noon.  Dispense: 30 each; Refill: 6 - Growth [place] verify EDD  2. Hypertension affecting pregnancy in second trimester - Pre-E Sx.  - CBC - Comp Met (CMET) - Protein / creatinine ratio, urine  Preterm labor symptoms and  general obstetric precautions including but not limited to vaginal bleeding, contractions, leaking of fluid and fetal movement were reviewed in detail with the patient. Please refer to After Visit Summary for other counseling recommendations.  Return in about 4 weeks (around 05/22/2017) for ROB.   ViManya SilvasCNM

## 2017-04-24 NOTE — Progress Notes (Signed)
Declines interpreter today and request record changed to do not need interpreter.

## 2017-04-25 LAB — PROTEIN / CREATININE RATIO, URINE
CREATININE, UR: 33.2 mg/dL
PROTEIN UR: 5.6 mg/dL
PROTEIN/CREAT RATIO: 169 mg/g{creat} (ref 0–200)

## 2017-04-25 LAB — COMPREHENSIVE METABOLIC PANEL
A/G RATIO: 0.9 — AB (ref 1.2–2.2)
ALK PHOS: 97 IU/L (ref 39–117)
ALT: 12 IU/L (ref 0–32)
AST: 14 IU/L (ref 0–40)
Albumin: 3.6 g/dL (ref 3.5–5.5)
BUN/Creatinine Ratio: 9 (ref 9–23)
BUN: 5 mg/dL — ABNORMAL LOW (ref 6–20)
Bilirubin Total: 0.2 mg/dL (ref 0.0–1.2)
CO2: 20 mmol/L (ref 20–29)
Calcium: 10 mg/dL (ref 8.7–10.2)
Chloride: 104 mmol/L (ref 96–106)
Creatinine, Ser: 0.53 mg/dL — ABNORMAL LOW (ref 0.57–1.00)
GFR calc Af Amer: 150 mL/min/{1.73_m2} (ref >59)
GFR, EST NON AFRICAN AMERICAN: 131 mL/min/{1.73_m2} (ref >59)
GLOBULIN, TOTAL: 3.8 g/dL (ref 1.5–4.5)
Glucose: 89 mg/dL (ref 65–99)
POTASSIUM: 4.6 mmol/L (ref 3.5–5.2)
SODIUM: 138 mmol/L (ref 134–144)
Total Protein: 7.4 g/dL (ref 6.0–8.5)

## 2017-04-25 LAB — CBC
Hematocrit: 38 % (ref 34.0–46.6)
Hemoglobin: 12.7 g/dL (ref 11.1–15.9)
MCH: 28.3 pg (ref 26.6–33.0)
MCHC: 33.4 g/dL (ref 31.5–35.7)
MCV: 85 fL (ref 79–97)
PLATELETS: 245 10*3/uL (ref 150–379)
RBC: 4.48 x10E6/uL (ref 3.77–5.28)
RDW: 14.3 % (ref 12.3–15.4)
WBC: 12.6 10*3/uL — AB (ref 3.4–10.8)

## 2017-05-07 ENCOUNTER — Ambulatory Visit (HOSPITAL_COMMUNITY)
Admission: RE | Admit: 2017-05-07 | Discharge: 2017-05-07 | Disposition: A | Discharge status: Home / Self Care | Payer: Medicaid Other | Source: Ambulatory Visit | Attending: Certified Nurse Midwife | Admitting: Certified Nurse Midwife

## 2017-05-07 ENCOUNTER — Encounter (HOSPITAL_COMMUNITY): Payer: Self-pay

## 2017-05-07 DIAGNOSIS — O99282 Endocrine, nutritional and metabolic diseases complicating pregnancy, second trimester: Secondary | ICD-10-CM | POA: Insufficient documentation

## 2017-05-07 DIAGNOSIS — E039 Hypothyroidism, unspecified: Secondary | ICD-10-CM | POA: Diagnosis not present

## 2017-05-07 DIAGNOSIS — O365920 Maternal care for other known or suspected poor fetal growth, second trimester, not applicable or unspecified: Secondary | ICD-10-CM | POA: Insufficient documentation

## 2017-05-07 DIAGNOSIS — Z3A21 21 weeks gestation of pregnancy: Secondary | ICD-10-CM | POA: Diagnosis not present

## 2017-05-25 ENCOUNTER — Ambulatory Visit (INDEPENDENT_AMBULATORY_CARE_PROVIDER_SITE_OTHER): Payer: Medicaid Other | Admitting: Advanced Practice Midwife

## 2017-05-25 VITALS — BP 112/74 | HR 91 | Wt 168.4 lb

## 2017-05-25 DIAGNOSIS — Z3480 Encounter for supervision of other normal pregnancy, unspecified trimester: Secondary | ICD-10-CM

## 2017-05-25 DIAGNOSIS — O99282 Endocrine, nutritional and metabolic diseases complicating pregnancy, second trimester: Secondary | ICD-10-CM

## 2017-05-25 DIAGNOSIS — E039 Hypothyroidism, unspecified: Secondary | ICD-10-CM

## 2017-05-25 NOTE — Progress Notes (Signed)
   PRENATAL VISIT NOTE  Subjective:  Amy Wagner is a 26 y.o. G2P1001 at 4334w2d being seen today for ongoing prenatal care.  She is currently monitored for the following issues for this low-risk pregnancy and has Hypothyroid; Supervision of normal pregnancy; Family history of diabetes mellitus in mother; and Hypertension affecting pregnancy in second trimester on their problem list.  Patient reports no complaints.  Contractions: Not present. Vag. Bleeding: None.  Movement: Present. Denies leaking of fluid.   The following portions of the patient's history were reviewed and updated as appropriate: allergies, current medications, past family history, past medical history, past social history, past surgical history and problem list. Problem list updated.  Objective:   Vitals:   05/25/17 0915  BP: 112/74  Pulse: 91  Weight: 168 lb 6.4 oz (76.4 kg)    Fetal Status: Fetal Heart Rate (bpm): 156 Fundal Height: 24 cm Movement: Present     General:  Alert, oriented and cooperative. Patient is in no acute distress.  Skin: Skin is warm and dry. No rash noted.   Cardiovascular: Normal heart rate noted  Respiratory: Normal respiratory effort, no problems with respiration noted  Abdomen: Soft, gravid, appropriate for gestational age.  Pain/Pressure: Absent     Pelvic: Cervical exam deferred        Extremities: Normal range of motion.  Edema: None  Mental Status:  Normal mood and affect. Normal behavior. Normal judgment and thought content.   Assessment and Plan:  Pregnancy: G2P1001 at 1934w2d  1. Hypothyroidism affecting pregnancy in second trimester - TSH at nex visit with GTT  2. Supervision of other normal pregnancy, antepartum - Routine care  - Patient with one isolated BP of 130/97 at 21 weeks. Labs were normal at that time, and no further B/P elevations. Pre-eclampsia warning signs reviewed, and will continue to monitor.   Preterm labor symptoms and general obstetric  precautions including but not limited to vaginal bleeding, contractions, leaking of fluid and fetal movement were reviewed in detail with the patient. Please refer to After Visit Summary for other counseling recommendations.  Return in about 4 weeks (around 06/22/2017).   Thressa ShellerHeather Malon Siddall, CNM

## 2017-05-25 NOTE — Patient Instructions (Signed)
Places to have your son circumcised:    [facility] [phone] $[phone] while you are in hospital  [facility] [phone] $244 by 4 wks  [facility] [phone] $[phone] by 2 wks  [facility] [phone] $250 by 7 days [facility] [phone] $150 by 4 wks  These prices sometimes change but are roughly what you can expect to pay. Please call and confirm pricing.   Circumcision is considered an elective/non-medically necessary procedure. There are many reasons parents decide to have their sons circumsized. During the first year of life circumcised males have a reduced risk of urinary tract infections but after this year the rates between circumcised males and uncircumcised males are the same.  It is safe to have your son circumcised outside of the hospital and the places above perform them regularly.   

## 2017-06-24 ENCOUNTER — Ambulatory Visit (INDEPENDENT_AMBULATORY_CARE_PROVIDER_SITE_OTHER): Payer: Medicaid Other

## 2017-06-24 VITALS — BP 128/80 | HR 105 | Wt 173.2 lb

## 2017-06-24 DIAGNOSIS — Z23 Encounter for immunization: Secondary | ICD-10-CM

## 2017-06-24 DIAGNOSIS — O0993 Supervision of high risk pregnancy, unspecified, third trimester: Secondary | ICD-10-CM | POA: Diagnosis present

## 2017-06-24 DIAGNOSIS — E039 Hypothyroidism, unspecified: Secondary | ICD-10-CM

## 2017-06-24 DIAGNOSIS — Z3480 Encounter for supervision of other normal pregnancy, unspecified trimester: Secondary | ICD-10-CM

## 2017-06-24 LAB — POCT URINALYSIS DIP (DEVICE)
BILIRUBIN URINE: NEGATIVE
Glucose, UA: NEGATIVE mg/dL
HGB URINE DIPSTICK: NEGATIVE
KETONES UR: NEGATIVE mg/dL
Nitrite: NEGATIVE
PH: 7 (ref 5.0–8.0)
Protein, ur: NEGATIVE mg/dL
SPECIFIC GRAVITY, URINE: 1.015 (ref 1.005–1.030)
Urobilinogen, UA: 0.2 mg/dL (ref 0.0–1.0)

## 2017-06-24 NOTE — Progress Notes (Signed)
28 week labs/tdap today

## 2017-06-24 NOTE — Patient Instructions (Signed)
Braxton Hicks Contractions Contractions of the uterus can occur throughout pregnancy, but they are not always a sign that you are in labor. You may have practice contractions called Braxton Hicks contractions. These false labor contractions are sometimes confused with true labor. What are Braxton Hicks contractions? Braxton Hicks contractions are tightening movements that occur in the muscles of the uterus before labor. Unlike true labor contractions, these contractions do not result in opening (dilation) and thinning of the cervix. Toward the end of pregnancy (32-34 weeks), Braxton Hicks contractions can happen more often and may become stronger. These contractions are sometimes difficult to tell apart from true labor because they can be very uncomfortable. You should not feel embarrassed if you go to the hospital with false labor. Sometimes, the only way to tell if you are in true labor is for your health care provider to look for changes in the cervix. The health care provider will do a physical exam and may monitor your contractions. If you are not in true labor, the exam should show that your cervix is not dilating and your water has not broken. If there are other health problems associated with your pregnancy, it is completely safe for you to be sent home with false labor. You may continue to have Braxton Hicks contractions until you go into true labor. How to tell the difference between true labor and false labor True labor  Contractions last 30-70 seconds.  Contractions become very regular.  Discomfort is usually felt in the top of the uterus, and it spreads to the lower abdomen and low back.  Contractions do not go away with walking.  Contractions usually become more intense and increase in frequency.  The cervix dilates and gets thinner. False labor  Contractions are usually shorter and not as strong as true labor contractions.  Contractions are usually irregular.  Contractions  are often felt in the front of the lower abdomen and in the groin.  Contractions may go away when you walk around or change positions while lying down.  Contractions get weaker and are shorter-lasting as time goes on.  The cervix usually does not dilate or become thin. Follow these instructions at home:  Take over-the-counter and prescription medicines only as told by your health care provider.  Keep up with your usual exercises and follow other instructions from your health care provider.  Eat and drink lightly if you think you are going into labor.  If Braxton Hicks contractions are making you uncomfortable: ? Change your position from lying down or resting to walking, or change from walking to resting. ? Sit and rest in a tub of warm water. ? Drink enough fluid to keep your urine pale yellow. Dehydration may cause these contractions. ? Do slow and deep breathing several times an hour.  Keep all follow-up prenatal visits as told by your health care provider. This is important. Contact a health care provider if:  You have a fever.  You have continuous pain in your abdomen. Get help right away if:  Your contractions become stronger, more regular, and closer together.  You have fluid leaking or gushing from your vagina.  You pass blood-tinged mucus (bloody show).  You have bleeding from your vagina.  You have low back pain that you never had before.  You feel your baby's head pushing down and causing pelvic pressure.  Your baby is not moving inside you as much as it used to. Summary  Contractions that occur before labor are called Braxton   Hicks contractions, false labor, or practice contractions.  Braxton Hicks contractions are usually shorter, weaker, farther apart, and less regular than true labor contractions. True labor contractions usually become progressively stronger and regular and they become more frequent.  Manage discomfort from [facility] contractions by  changing position, resting in a warm bath, drinking plenty of water, or practicing deep breathing. This information is not intended to replace advice given to you by your health care provider. Make sure you discuss any questions you have with your health care provider. Document Released: 10/30/2016 Document Revised: 10/30/2016 Document Reviewed: 10/30/2016 Elsevier Interactive Patient Education  2018 [employer]. Glucose Tolerance Test During Pregnancy The glucose tolerance test (GTT) is a blood test used to determine if you have developed a type of diabetes during pregnancy (gestational diabetes). This is when your body does not properly process sugar (glucose) in the food you eat, resulting in high blood glucose levels. Typically, a GTT is done after you have had a 1-hour glucose test with results that indicate you possibly have gestational diabetes. It may also be done if:  You have a history of giving birth to very large babies or have experienced repeated fetal loss (stillbirth).  You have signs and symptoms of diabetes, such as: ? Changes in your vision. ? Tingling or numbness in your hands or feet. ? Changes in hunger, thirst, and urination not otherwise explained by your pregnancy.  The GTT lasts about 3 hours. You will be given a sugar-water solution to drink at the beginning of the test. You will have blood drawn before you drink the solution and then again 1, 2, and 3 hours after you drink it. You will not be allowed to eat or drink anything else during the test. You must remain at the testing location to make sure that your blood is drawn on time. You should also avoid exercising during the test, because exercise can alter test results. How do I prepare for this test? Eat normally for 3 days prior to the GTT test, including having plenty of carbohydrate-rich foods. Do not eat or drink anything except water during the final 12 hours before the test. In addition, your health care provider  may ask you to stop taking certain medicines before the test. What do the results mean? It is your responsibility to obtain your test results. Ask the lab or department performing the test when and how you will get your results. Contact your health care provider to discuss any questions you have about your results. Range of Normal Values Ranges for normal values may vary among different labs and hospitals. You should always check with your health care provider after having lab work or other tests done to discuss whether your values are considered within normal limits. Normal levels of blood glucose are as follows:  Fasting: less than 105 mg/dL.  1 hour after drinking the solution: less than 190 mg/dL.  2 hours after drinking the solution: less than 165 mg/dL.  3 hours after drinking the solution: less than 145 mg/dL.  Some substances can interfere with GTT results. These may include:  Blood pressure and heart failure medicines, including beta blockers, furosemide, and thiazides.  Anti-inflammatory medicines, including aspirin.  Nicotine.  Some psychiatric medicines.  Meaning of Results Outside Normal Value Ranges GTT test results that are above normal values may indicate a number of health problems, such as:  Gestational diabetes.  Acute stress response.  Cushing syndrome.  Tumors such as pheochromocytoma or glucagonoma.  Long-term kidney problems.  Pancreatitis.  Hyperthyroidism.  Current infection.  Discuss your test results with your health care provider. He or she will use the results to make a diagnosis and determine a treatment plan that is right for you. This information is not intended to replace advice given to you by your health care provider. Make sure you discuss any questions you have with your health care provider. Document Released: 12/16/2011 Document Revised: 11/22/2015 Document Reviewed: 10/21/2013 Elsevier Interactive Patient Education  [employer]2018 Elsevier  Inc.

## 2017-06-24 NOTE — Progress Notes (Addendum)
   PRENATAL VISIT NOTE  Subjective:  Amy Wagner is a 26 y.o. G2P1001 at 3354w4d being seen today for ongoing prenatal care.  She is currently monitored for the following issues for this low-risk pregnancy and has Hypothyroid; Supervision of normal pregnancy; Family history of diabetes mellitus in mother; and Hypertension affecting pregnancy in second trimester on their problem list.  Patient reports backache. She report pain in mid back when bending over and rolling over in bed. Contractions: Not present. Vag. Bleeding: None.  Movement: Present. Denies leaking of fluid.   The following portions of the patient's history were reviewed and updated as appropriate: allergies, current medications, past family history, past medical history, past social history, past surgical history and problem list. Problem list updated.  Objective:   Vitals:   06/24/17 0754  BP: 128/80  Pulse: (!) 105  Weight: 173 lb 3.2 oz (78.6 kg)    Fetal Status: Fetal Heart Rate (bpm): 150 Fundal Height: 28 cm Movement: Present     General:  Alert, oriented and cooperative. Patient is in no acute distress.  Skin: Skin is warm and dry. No rash noted.   Cardiovascular: Normal heart rate noted  Respiratory: Normal respiratory effort, no problems with respiration noted  Abdomen: Soft, gravid, appropriate for gestational age.  Pain/Pressure: Present     Pelvic: Cervical exam deferred        Extremities: Normal range of motion.  Edema: None  Mental Status:  Normal mood and affect. Normal behavior. Normal judgment and thought content.   Assessment and Plan:  Pregnancy: G2P1001 at 7654w4d  1. Supervision of high risk pregnancy, antepartum, third trimester -Patient has 83mo old child she is bending to pick up and play with. Encouraged patient to use tylenol and heat packs for back pain. Encouraged use of maternity support belt and increasing fluid intake.   - Glucose Tolerance, 2 Hours w/1 Hour - CBC - HIV  antibody (with reflex) - RPR - TSH - Culture, OB Urine  2. Need for diphtheria-tetanus-pertussis (Tdap) vaccine - Tdap vaccine greater than or equal to 7yo IM  3. Hypothyroidism, unspecified type -TSH today  Preterm labor symptoms and general obstetric precautions including but not limited to vaginal bleeding, contractions, leaking of fluid and fetal movement were reviewed in detail with the patient. Please refer to After Visit Summary for other counseling recommendations.  Return in about 2 weeks (around 07/08/2017) for Return OB visit.  Rolm BookbinderCaroline M Mela Perham, CNM  06/24/17 3:05 PM

## 2017-06-25 ENCOUNTER — Other Ambulatory Visit: Payer: Self-pay

## 2017-06-25 DIAGNOSIS — Z3400 Encounter for supervision of normal first pregnancy, unspecified trimester: Secondary | ICD-10-CM

## 2017-06-25 LAB — CBC
Hematocrit: 39.3 % (ref 34.0–46.6)
Hemoglobin: 12.9 g/dL (ref 11.1–15.9)
MCH: 27.8 pg (ref 26.6–33.0)
MCHC: 32.8 g/dL (ref 31.5–35.7)
MCV: 85 fL (ref 79–97)
PLATELETS: 191 10*3/uL (ref 150–379)
RBC: 4.64 x10E6/uL (ref 3.77–5.28)
RDW: 14.7 % (ref 12.3–15.4)
WBC: 14.2 10*3/uL — AB (ref 3.4–10.8)

## 2017-06-25 LAB — HIV ANTIBODY (ROUTINE TESTING W REFLEX): HIV Screen 4th Generation wRfx: NONREACTIVE

## 2017-06-25 LAB — RPR: RPR Ser Ql: NONREACTIVE

## 2017-06-25 LAB — GLUCOSE TOLERANCE, 2 HOURS W/ 1HR
GLUCOSE, FASTING: 86 mg/dL (ref 65–91)
Glucose, 1 hour: 147 mg/dL (ref 65–179)
Glucose, 2 hour: 135 mg/dL (ref 65–152)

## 2017-06-25 LAB — TSH: TSH: 4.97 u[IU]/mL — ABNORMAL HIGH (ref 0.450–4.500)

## 2017-06-25 MED ORDER — PRENATAL PLUS 27-1 MG PO TABS: 1.0000 | ORAL_TABLET | Freq: Every day | ORAL | 6 refills | Status: DC

## 2017-06-25 MED ORDER — LEVOTHYROXINE SODIUM 25 MCG PO TABS: 50.0000 ug | ORAL_TABLET | Freq: Every day | ORAL | 2 refills | Status: DC

## 2017-06-25 NOTE — Progress Notes (Unsigned)
TSH elevated. Reviewed with Dr. Debroah LoopArnold and will increase dose of levothyroxine to 50mcg daily. Patient notified and requested refill of medication. RX sent to pharmacy.  Rolm BookbinderCaroline M Annaliz Aven, CNM 06/25/17 5:03 PM

## 2017-06-26 LAB — CULTURE, OB URINE

## 2017-06-26 LAB — URINE CULTURE, OB REFLEX

## 2017-06-30 NOTE — L&D Delivery Note (Signed)
Patient is a 27 y.o. now G2P2 s/p NSVD at 2131w2d, who was admitted for SOL.  She progressed with augmentation (AROM at complete dilitation) pushed 7minutes to deliver.  Cord clamping delayed by several minutes then clamped by CNM and cut by FOB. She plans on breastfeeding. She requests IUD for birth control.  Delivery Note At 9:15 PM a viable and healthy female was delivered via Vaginal, Spontaneous (Presentation:ROA ).  APGAR: 8, 9; weight pending .   Placenta intact and spontaneous, bleeding minimal. 3V Cord:  With no complications   Anesthesia: None  Episiotomy: None Lacerations: 1st degree- not repaired, hemostatic  Suture Repair: None Est. Blood Loss (mL): 100  Mom and baby stable prior to transfer to postpartum. Baby to Couplet care / Skin to Skin.  Sharyon CableVeronica C Sheikh Leverich 09/14/2017, 9:58 PM

## 2017-07-22 ENCOUNTER — Ambulatory Visit (INDEPENDENT_AMBULATORY_CARE_PROVIDER_SITE_OTHER): Payer: Medicaid Other

## 2017-07-22 VITALS — BP 118/80 | HR 92 | Wt 178.0 lb

## 2017-07-22 DIAGNOSIS — E039 Hypothyroidism, unspecified: Secondary | ICD-10-CM

## 2017-07-22 DIAGNOSIS — Z3481 Encounter for supervision of other normal pregnancy, first trimester: Secondary | ICD-10-CM

## 2017-07-22 NOTE — Progress Notes (Signed)
   PRENATAL VISIT NOTE  Subjective:  Amy Wagner is a 27 y.o. G2P1001 at 7967w4d being seen today for ongoing prenatal care.  She is currently monitored for the following issues for this low-risk pregnancy and has Hypothyroid; Supervision of normal pregnancy; Family history of diabetes mellitus in mother; and Hypertension affecting pregnancy in second trimester on their problem list.  Patient reports a small rash on her 5th left PIP.  She has not tried any over the counter methods for treatment at this time.  .  Contractions: Not present. Vag. Bleeding: None.  Movement: Present. Denies leaking of fluid.   The following portions of the patient's history were reviewed and updated as appropriate: allergies, current medications, past family history, past medical history, past social history, past surgical history and problem list. Problem list updated.  Objective:   Vitals:   07/22/17 0830  BP: 118/80  Pulse: 92  Weight: 178 lb (80.7 kg)    Fetal Status: Fetal Heart Rate (bpm): 152 Fundal Height: 32 cm Movement: Present     General:  Alert, oriented and cooperative. Patient is in no acute distress.  Skin: Skin is warm and dry. No rash noted.   Cardiovascular: Normal heart rate noted  Respiratory: Normal respiratory effort, no problems with respiration noted  Abdomen: Soft, gravid, appropriate for gestational age.  Pain/Pressure: Absent     Pelvic: Cervical exam deferred        Extremities: Normal range of motion.  Edema: None  Mental Status:  Normal mood and affect. Normal behavior. Normal judgment and thought content.   Assessment and Plan:  Pregnancy: G2P1001 at 3467w4d  1. Encounter for supervision of other normal pregnancy in first trimester Other than a small rash on her left 5th PIP the patient does not report any issues at this time.   -Recommend over the counter steroid creams for initial treatment.    2. Hypothyroidism, unspecified type Patients TSH was slightly  elevated at her last visit and the patient was instructed to increase her dose to 50mcg daily.  She is asymptomatic at this time.    Term labor symptoms and general obstetric precautions including but not limited to vaginal bleeding, contractions, leaking of fluid and fetal movement were reviewed in detail with the patient. Please refer to After Visit Summary for other counseling recommendations.  Return in about 2 weeks (around 08/05/2017) for Return OB visit.   Ames Coupeharles A McLendon, Medical Student   I confirm that I have verified the information documented in the medical student's note and that I have also personally reperformed the physical exam and all medical decision making activities.  Rolm BookbinderCaroline M Cienna Dumais, CNM 07/22/17 9:18 AM

## 2017-07-22 NOTE — Patient Instructions (Signed)

## 2017-08-05 ENCOUNTER — Ambulatory Visit (INDEPENDENT_AMBULATORY_CARE_PROVIDER_SITE_OTHER): Payer: Medicaid Other | Admitting: Student

## 2017-08-05 VITALS — BP 115/70 | HR 86 | Wt 179.5 lb

## 2017-08-05 DIAGNOSIS — Z3400 Encounter for supervision of normal first pregnancy, unspecified trimester: Secondary | ICD-10-CM

## 2017-08-05 DIAGNOSIS — E039 Hypothyroidism, unspecified: Secondary | ICD-10-CM

## 2017-08-05 DIAGNOSIS — Z3403 Encounter for supervision of normal first pregnancy, third trimester: Secondary | ICD-10-CM

## 2017-08-05 MED ORDER — PRENATAL PLUS 27-1 MG PO TABS: 1.0000 | ORAL_TABLET | Freq: Every day | ORAL | 6 refills | Status: DC

## 2017-08-05 NOTE — Patient Instructions (Signed)
Hypothyroidism and Pregnancy Hypothyroidism is a condition that develops if you have an underactive thyroid gland. The thyroid is a small, butterfly-shaped gland in your neck, and it is located in front of your windpipe. It makes the thyroid hormones triiodothyronine (T3) and thyroxine (T4). These hormones play an important role in regulating your breathing, heart rate, menstrual cycle, body temperature, and other bodily functions. If you have hypothyroidism, your thyroid gland does not produce enough thyroid hormones. When you are pregnant, your body uses more thyroid hormones. This can cause mild hypothyroidism to get worse. Hypothyroidism during pregnancy can lead to complications, including high blood pressure that develops after the 20th week of pregnancy (preeclampsia). Hypothyroidism can also affect your baby. Babies need thyroid hormone from their mothers for normal growth and brain development. Babies born to mothers with hypothyroidism during pregnancy may be born prematurely and have lower mental abilities. What are the signs or symptoms? Symptoms of hypothyroidism can develop slowly. Symptoms include:  Fatigue.  Weight gain.  Constipation.  Feeling cold more often than others do.  Muscle aches.  How is this diagnosed? Your health care provider may suspect hypothyroidism based on your symptoms. The health care provider will also do a physical exam to check your neck. He or she will do this while you swallow so that it will be easier to feel your thyroid gland. You may also have tests to confirm the diagnosis, including:  Blood tests.  An imaging study using sound waves and a computer (ultrasound).  How is this treated? Hypothyroid treatment during pregnancy includes:  Monitoring. If you have mild hypothyroidism, your health care provider will monitor your thyroid hormone levels closely to watch for any changes.  Medicine prescribed by your health care provider to control your  thyroid hormone levels.  Follow these instructions at home:  Take medicines only as directed by your health care provider. Check with your health care provider before taking any hypothyroid medicines that were prescribed before you became pregnant. Many are safe, but some treatments for hypothyroidism may have to be stopped during pregnancy.  Some women need extra iodine during pregnancy. Ask your health care provider whether you should: ? Get more iodine in your diet. ? Take a prenatal vitamin containing iodine. ? Take iodine supplements. Contact a health care provider if:  You notice the onset of hypothyroidism symptoms that you did not have before.  You gain more than 5 lb (2.3 kg) in 1 week. For women at a normal weight, it is normal to gain about 1 pound per week during pregnancy.  You have a lump in your neck.  You have a scratchy throat or difficulty speaking that lasts longer than a month and is not related to a cold.  You have a hard time swallowing. Get help right away if:  Your baby is less active than normal. You may be asked to perform kick counts to monitor your baby's movements. If your baby moves fewer than 10 times in 2 hours during a period when the baby is usually active (typically in the evening), you should see your health care provider right away.  Your baby stops moving completely.  You develop muscle cramps.  You have belly pain.  You have heavy bleeding.  You develop a fever or chills.  You have a very bad headache or vision problems.  You develop swelling in your legs and ankles. This information is not intended to replace advice given to you by your health care provider. Make   sure you discuss any questions you have with your health care provider. Document Released: 04/13/2007 Document Revised: 11/22/2015 Document Reviewed: 11/16/2013 Elsevier Interactive Patient Education  2018 [employer]  

## 2017-08-05 NOTE — Progress Notes (Signed)
   PRENATAL VISIT NOTE  Subjective:  Amy Wagner is a 27 y.o. G2P1001 at 1555w4d being seen today for ongoing prenatal care.  She is currently monitored for the following issues for this low-risk pregnancy and has Hypothyroid; Supervision of normal pregnancy; Family history of diabetes mellitus in mother; and Hypertension affecting pregnancy in second trimester on their problem list.  Patient reports no complaints.  Contractions: Not present. Vag. Bleeding: None.  Movement: Present. Denies leaking of fluid.   The following portions of the patient's history were reviewed and updated as appropriate: allergies, current medications, past family history, past medical history, past social history, past surgical history and problem list. Problem list updated.  Objective:   Vitals:   08/05/17 0943  BP: 115/70  Pulse: 86  Weight: 179 lb 8 oz (81.4 kg)    Fetal Status: Fetal Heart Rate (bpm): 142 Fundal Height: 34 cm Movement: Present     General:  Alert, oriented and cooperative. Patient is in no acute distress.  Skin: Skin is warm and dry. No rash noted.   Cardiovascular: Normal heart rate noted  Respiratory: Normal respiratory effort, no problems with respiration noted  Abdomen: Soft, gravid, appropriate for gestational age.  Pain/Pressure: Present     Pelvic: Cervical exam deferred        Extremities: Normal range of motion.  Edema: Trace  Mental Status:  Normal mood and affect. Normal behavior. Normal judgment and thought content.   Assessment and Plan:  Pregnancy: G2P1001 at 5055w4d  1. Hypothyroidism, unspecified type Has been taking synthroid at 50 mcg since December, will recheck TSH today.  - TSH  2. Supervision of normal first pregnancy, antepartum  - prenatal vitamin w/FE, FA (PRENATAL 1 + 1) 27-1 MG TABS tablet; Take 1 tablet by mouth daily at 12 noon.  Dispense: 30 each; Refill: 6  Preterm labor symptoms and general obstetric precautions including but not limited  to vaginal bleeding, contractions, leaking of fluid and fetal movement were reviewed in detail with the patient. Please refer to After Visit Summary for other counseling recommendations.  Return in about 2 weeks (around 08/19/2017), or LROB.   Amy Wagner, CNM

## 2017-08-06 LAB — TSH: TSH: 2.46 u[IU]/mL (ref 0.450–4.500)

## 2017-08-19 ENCOUNTER — Other Ambulatory Visit (HOSPITAL_COMMUNITY)
Admission: RE | Admit: 2017-08-19 | Discharge: 2017-08-19 | Disposition: A | Discharge status: Home / Self Care | Payer: Medicaid Other | Source: Ambulatory Visit | Attending: Advanced Practice Midwife | Admitting: Advanced Practice Midwife

## 2017-08-19 ENCOUNTER — Ambulatory Visit (INDEPENDENT_AMBULATORY_CARE_PROVIDER_SITE_OTHER): Payer: Medicaid Other | Admitting: Advanced Practice Midwife

## 2017-08-19 ENCOUNTER — Encounter: Payer: Self-pay | Admitting: Advanced Practice Midwife

## 2017-08-19 VITALS — BP 127/82 | HR 114 | Wt 181.6 lb

## 2017-08-19 DIAGNOSIS — Z3480 Encounter for supervision of other normal pregnancy, unspecified trimester: Secondary | ICD-10-CM

## 2017-08-19 DIAGNOSIS — Z3483 Encounter for supervision of other normal pregnancy, third trimester: Secondary | ICD-10-CM | POA: Diagnosis not present

## 2017-08-19 NOTE — Progress Notes (Signed)
   PRENATAL VISIT NOTE  Subjective:  Amy Wagner is a 27 y.o. G2P1001 at 5612w4d being seen today for ongoing prenatal care.  She is currently monitored for the following issues for this low-risk pregnancy and has Hypothyroid; Supervision of normal pregnancy; Family history of diabetes mellitus in mother; and Hypertension affecting pregnancy in second trimester on their problem list.  Patient reports no complaints.  Contractions: Not present. Vag. Bleeding: None.  Movement: Present. Denies leaking of fluid.   The following portions of the patient's history were reviewed and updated as appropriate: allergies, current medications, past family history, past medical history, past social history, past surgical history and problem list. Problem list updated.  Objective:   Vitals:   08/19/17 0825  BP: 127/82  Pulse: (!) 114  Weight: 181 lb 9.6 oz (82.4 kg)    Fetal Status: Fetal Heart Rate (bpm): 144 Fundal Height: 36 cm Movement: Present  Presentation: Vertex  General:  Alert, oriented and cooperative. Patient is in no acute distress.  Skin: Skin is warm and dry. No rash noted.   Cardiovascular: Normal heart rate noted  Respiratory: Normal respiratory effort, no problems with respiration noted  Abdomen: Soft, gravid, appropriate for gestational age.  Pain/Pressure: Present     Pelvic: Cervical exam performed Dilation: 2 Effacement (%): 50 Station: -2  Extremities: Normal range of motion.  Edema: Trace  Mental Status:  Normal mood and affect. Normal behavior. Normal judgment and thought content.   Assessment and Plan:  Pregnancy: G2P1001 at 6112w4d  1. Supervision of other normal pregnancy, antepartum - GBS today   Term labor symptoms and general obstetric precautions including but not limited to vaginal bleeding, contractions, leaking of fluid and fetal movement were reviewed in detail with the patient. Please refer to After Visit Summary for other counseling recommendations.    Return in about 1 week (around 08/26/2017).   Thressa ShellerHeather Hogan, CNM

## 2017-08-19 NOTE — Patient Instructions (Signed)
Vaginal delivery means that you will give birth by pushing your baby out of your birth canal (vagina). A team of health care providers will help you before, during, and after vaginal delivery. Birth experiences are unique for every woman and every pregnancy, and birth experiences vary depending on where you choose to give birth. What should I do to prepare for my baby's birth? Before your baby is born, it is important to talk with your health care provider about:  Your labor and delivery preferences. These may include: ? Medicines that you may be given. ? How you will manage your pain. This might include non-medical pain relief techniques or injectable pain relief such as epidural analgesia. ? How you and your baby will be monitored during labor and delivery. ? Who may be in the labor and delivery room with you. ? Your feelings about surgical delivery of your baby (cesarean delivery, or C-section) if this becomes necessary. ? Your feelings about receiving donated blood through an IV tube (blood transfusion) if this becomes necessary.  Whether you are able: ? To take pictures or videos of the birth. ? To eat during labor and delivery. ? To move around, walk, or change positions during labor and delivery.  What to expect after your baby is born, such as: ? Whether delayed umbilical cord clamping and cutting is offered. ? Who will care for your baby right after birth. ? Medicines or tests that may be recommended for your baby. ? Whether breastfeeding is supported in your hospital or birth center. ? How long you will be in the hospital or birth center.  How any medical conditions you have may affect your baby or your labor and delivery experience.  To prepare for your baby's birth, you should also:  Attend all of your health care visits before delivery (prenatal visits) as recommended by your health care provider. This is important.  Prepare your home for your baby's arrival. Make sure  that you have: ? Diapers. ? Baby clothing. ? Feeding equipment. ? Safe sleeping arrangements for you and your baby.  Install a car seat in your vehicle. Have your car seat checked by a certified car seat installer to make sure that it is installed safely.  Think about who will help you with your new baby at home for at least the first several weeks after delivery.  What can I expect when I arrive at the birth center or hospital? Once you are in labor and have been admitted into the hospital or birth center, your health care provider may:  Review your pregnancy history and any concerns you have.  Insert an IV tube into one of your veins. This is used to give you fluids and medicines.  Check your blood pressure, pulse, temperature, and heart rate (vital signs).  Check whether your bag of water (amniotic sac) has broken (ruptured).  Talk with you about your birth plan and discuss pain control options.  Monitoring Your health care provider may monitor your contractions (uterine monitoring) and your baby's heart rate (fetal monitoring). You may need to be monitored:  Often, but not continuously (intermittently).  All the time or for long periods at a time (continuously). Continuous monitoring may be needed if: ? You are taking certain medicines, such as medicine to relieve pain or make your contractions stronger. ? You have pregnancy or labor complications.  Monitoring may be done by:  Placing a special stethoscope or a handheld monitoring device on your abdomen to check your   baby's heartbeat, and feeling your abdomen for contractions. This method of monitoring does not continuously record your baby's heartbeat or your contractions.  Placing monitors on your abdomen (external monitors) to record your baby's heartbeat and the frequency and length of contractions. You may not have to wear external monitors all the time.  Placing monitors inside of your uterus (internal monitors) to  record your baby's heartbeat and the frequency, length, and strength of your contractions. ? Your health care provider may use internal monitors if he or she needs more information about the strength of your contractions or your baby's heart rate. ? Internal monitors are put in place by passing a thin, flexible wire through your vagina and into your uterus. Depending on the type of monitor, it may remain in your uterus or on your baby's head until birth. ? Your health care provider will discuss the benefits and risks of internal monitoring with you and will ask for your permission before inserting the monitors.  Telemetry. This is a type of continuous monitoring that can be done with external or internal monitors. Instead of having to stay in bed, you are able to move around during telemetry. Ask your health care provider if telemetry is an option for you.  Physical exam Your health care provider may perform a physical exam. This may include:  Checking whether your baby is positioned: ? With the head toward your vagina (head-down). This is most common. ? With the head toward the top of your uterus (head-up or breech). If your baby is in a breech position, your health care provider may try to turn your baby to a head-down position so you can deliver vaginally. If it does not seem that your baby can be born vaginally, your provider may recommend surgery to deliver your baby. In rare cases, you may be able to deliver vaginally if your baby is head-up (breech delivery). ? Lying sideways (transverse). Babies that are lying sideways cannot be delivered vaginally.  Checking your cervix to determine: ? Whether it is thinning out (effacing). ? Whether it is opening up (dilating). ? How low your baby has moved into your birth canal.  What are the three stages of labor and delivery?  Normal labor and delivery is divided into the following three stages: Stage 1  Stage 1 is the longest stage of labor,  and it can last for hours or days. Stage 1 includes: ? Early labor. This is when contractions may be irregular, or regular and mild. Generally, early labor contractions are more than 10 minutes apart. ? Active labor. This is when contractions get longer, more regular, more frequent, and more intense. ? The transition phase. This is when contractions happen very close together, are very intense, and may last longer than during any other part of labor.  Contractions generally feel mild, infrequent, and irregular at first. They get stronger, more frequent (about every 2-3 minutes), and more regular as you progress from early labor through active labor and transition.  Many women progress through stage 1 naturally, but you may need help to continue making progress. If this happens, your health care provider may talk with you about: ? Rupturing your amniotic sac if it has not ruptured yet. ? Giving you medicine to help make your contractions stronger and more frequent.  Stage 1 ends when your cervix is completely dilated to 4 inches (10 cm) and completely effaced. This happens at the end of the transition phase. Stage 2  Once your cervix   is completely effaced and dilated to 4 inches (10 cm), you may start to feel an urge to push. It is common for the body to naturally take a rest before feeling the urge to push, especially if you received an epidural or certain other pain medicines. This rest period may last for up to 1-2 hours, depending on your unique labor experience.  During stage 2, contractions are generally less painful, because pushing helps relieve contraction pain. Instead of contraction pain, you may feel stretching and burning pain, especially when the widest part of your baby's head passes through the vaginal opening (crowning).  Your health care provider will closely monitor your pushing progress and your baby's progress through the vagina during stage 2.  Your health care provider may  massage the area of skin between your vaginal opening and anus (perineum) or apply warm compresses to your perineum. This helps it stretch as the baby's head starts to crown, which can help prevent perineal tearing. ? In some cases, an incision may be made in your perineum (episiotomy) to allow the baby to pass through the vaginal opening. An episiotomy helps to make the opening of the vagina larger to allow more room for the baby to fit through.  It is very important to breathe and focus so your health care provider can control the delivery of your baby's head. Your health care provider may have you decrease the intensity of your pushing, to help prevent perineal tearing.  After delivery of your baby's head, the shoulders and the rest of the body generally deliver very quickly and without difficulty.  Once your baby is delivered, the umbilical cord may be cut right away, or this may be delayed for 1-2 minutes, depending on your baby's health. This may vary among health care providers, hospitals, and birth centers.  If you and your baby are healthy enough, your baby may be placed on your chest or abdomen to help maintain the baby's temperature and to help you bond with each other. Some mothers and babies start breastfeeding at this time. Your health care team will dry your baby and help keep your baby warm during this time.  Your baby may need immediate care if he or she: ? Showed signs of distress during labor. ? Has a medical condition. ? Was born too early (prematurely). ? Had a bowel movement before birth (meconium). ? Shows signs of difficulty transitioning from being inside the uterus to being outside of the uterus. If you are planning to breastfeed, your health care team will help you begin a feeding. Stage 3  The third stage of labor starts immediately after the birth of your baby and ends after you deliver the placenta. The placenta is an organ that develops during pregnancy to provide  oxygen and nutrients to your baby in the womb.  Delivering the placenta may require some pushing, and you may have mild contractions. Breastfeeding can stimulate contractions to help you deliver the placenta.  After the placenta is delivered, your uterus should tighten (contract) and become firm. This helps to stop bleeding in your uterus. To help your uterus contract and to control bleeding, your health care provider may: ? Give you medicine by injection, through an IV tube, by mouth, or through your rectum (rectally). ? Massage your abdomen or perform a vaginal exam to remove any blood clots that are left in your uterus. ? Empty your bladder by placing a thin, flexible tube (catheter) into your bladder. ? Encourage you to   breastfeed your baby. After labor is over, you and your baby will be monitored closely to ensure that you are both healthy until you are ready to go home. Your health care team will teach you how to care for yourself and your baby. This information is not intended to replace advice given to you by your health care provider. Make sure you discuss any questions you have with your health care provider. Document Released: 03/25/2008 Document Revised: 01/04/2016 Document Reviewed: 07/01/2015 Elsevier Interactive Patient Education  2018 [employer]  

## 2017-08-20 LAB — GC/CHLAMYDIA PROBE AMP (~~LOC~~) NOT AT ARMC
Chlamydia: NEGATIVE
NEISSERIA GONORRHEA: NEGATIVE

## 2017-08-23 LAB — CULTURE, BETA STREP (GROUP B ONLY): STREP GP B CULTURE: NEGATIVE

## 2017-08-26 ENCOUNTER — Ambulatory Visit (INDEPENDENT_AMBULATORY_CARE_PROVIDER_SITE_OTHER): Payer: Medicaid Other | Admitting: Obstetrics and Gynecology

## 2017-08-26 VITALS — BP 124/86 | HR 103 | Wt 184.3 lb

## 2017-08-26 DIAGNOSIS — Z3480 Encounter for supervision of other normal pregnancy, unspecified trimester: Secondary | ICD-10-CM

## 2017-08-26 NOTE — Progress Notes (Signed)
   PRENATAL VISIT NOTE  Subjective:  Amy Wagner is a 27 y.o. G2P1001 at 4562w4d being seen today for ongoing prenatal care.  She is currently monitored for the following issues for this low-risk pregnancy and has Hypothyroid; Supervision of normal pregnancy; Family history of diabetes mellitus in mother; and Hypertension affecting pregnancy in second trimester on their problem list.  Patient reports no complaints.  Contractions: Not present. Vag. Bleeding: None.  Movement: Present. Denies leaking of fluid.   The following portions of the patient's history were reviewed and updated as appropriate: allergies, current medications, past family history, past medical history, past social history, past surgical history and problem list. Problem list updated.  Objective:   Vitals:   08/26/17 0826  BP: 124/86  Pulse: (!) 103  Weight: 184 lb 4.8 oz (83.6 kg)    Fetal Status: Fetal Heart Rate (bpm): 150 Fundal Height: 37 cm Movement: Present     General:  Alert, oriented and cooperative. Patient is in no acute distress.  Skin: Skin is warm and dry. No rash noted.   Cardiovascular: Normal heart rate noted  Respiratory: Normal respiratory effort, no problems with respiration noted  Abdomen: Soft, gravid, appropriate for gestational age.  Pain/Pressure: Present     Pelvic: Cervical exam deferred        Extremities: Normal range of motion.  Edema: Trace  Mental Status:  Normal mood and affect. Normal behavior. Normal judgment and thought content.   Assessment and Plan:  Pregnancy: G2P1001 at 1162w4d  Supervision of other normal pregnancy, antepartum - Discussed labor precautions - Educated on how to time contractions - Desires natural labor and delivery  Term labor symptoms and general obstetric precautions including but not limited to vaginal bleeding, contractions, leaking of fluid and fetal movement were reviewed in detail with the patient. Please refer to After Visit Summary for  other counseling recommendations.  Return in about 1 week (around 09/02/2017) for Return OB visit.   Raelyn Moraolitta Seniya Stoffers, CNM

## 2017-08-26 NOTE — Patient Instructions (Signed)
Braxton Hicks Contractions °Contractions of the uterus can occur throughout pregnancy, but they are not always a sign that you are in labor. You may have practice contractions called Braxton Hicks contractions. These false labor contractions are sometimes confused with true labor. °What are Braxton Hicks contractions? °Braxton Hicks contractions are tightening movements that occur in the muscles of the uterus before labor. Unlike true labor contractions, these contractions do not result in opening (dilation) and thinning of the cervix. Toward the end of pregnancy (32-34 weeks), Braxton Hicks contractions can happen more often and may become stronger. These contractions are sometimes difficult to tell apart from true labor because they can be very uncomfortable. You should not feel embarrassed if you go to the hospital with false labor. °Sometimes, the only way to tell if you are in true labor is for your health care provider to look for changes in the cervix. The health care provider will do a physical exam and may monitor your contractions. If you are not in true labor, the exam should show that your cervix is not dilating and your water has not broken. °If there are other health problems associated with your pregnancy, it is completely safe for you to be sent home with false labor. You may continue to have Braxton Hicks contractions until you go into true labor. °How to tell the difference between true labor and false labor °True labor °· Contractions last 30-70 seconds. °· Contractions become very regular. °· Discomfort is usually felt in the top of the uterus, and it spreads to the lower abdomen and low back. °· Contractions do not go away with walking. °· Contractions usually become more intense and increase in frequency. °· The cervix dilates and gets thinner. °False labor °· Contractions are usually shorter and not as strong as true labor contractions. °· Contractions are usually irregular. °· Contractions  are often felt in the front of the lower abdomen and in the groin. °· Contractions may go away when you walk around or change positions while lying down. °· Contractions get weaker and are shorter-lasting as time goes on. °· The cervix usually does not dilate or become thin. °Follow these instructions at home: °· Take over-the-counter and prescription medicines only as told by your health care provider. °· Keep up with your usual exercises and follow other instructions from your health care provider. °· Eat and drink lightly if you think you are going into labor. °· If Braxton Hicks contractions are making you uncomfortable: °? Change your position from lying down or resting to walking, or change from walking to resting. °? Sit and rest in a tub of warm water. °? Drink enough fluid to keep your urine pale yellow. Dehydration may cause these contractions. °? Do slow and deep breathing several times an hour. °· Keep all follow-up prenatal visits as told by your health care provider. This is important. °Contact a health care provider if: °· You have a fever. °· You have continuous pain in your abdomen. °Get help right away if: °· Your contractions become stronger, more regular, and closer together. °· You have fluid leaking or gushing from your vagina. °· You pass blood-tinged mucus (bloody show). °· You have bleeding from your vagina. °· You have low back pain that you never had before. °· You feel your baby’s head pushing down and causing pelvic pressure. °· Your baby is not moving inside you as much as it used to. °Summary °· Contractions that occur before labor are called Braxton   Hicks contractions, false labor, or practice contractions. °· Braxton Hicks contractions are usually shorter, weaker, farther apart, and less regular than true labor contractions. True labor contractions usually become progressively stronger and regular and they become more frequent. °· Manage discomfort from Braxton Hicks contractions by  changing position, resting in a warm bath, drinking plenty of water, or practicing deep breathing. °This information is not intended to replace advice given to you by your health care provider. Make sure you discuss any questions you have with your health care provider. °Document Released: 10/30/2016 Document Revised: 10/30/2016 Document Reviewed: 10/30/2016 °Elsevier Interactive Patient Education © 2018 [employer] Inc[employer] ° °

## 2017-09-03 ENCOUNTER — Ambulatory Visit (INDEPENDENT_AMBULATORY_CARE_PROVIDER_SITE_OTHER): Payer: Medicaid Other | Admitting: Student

## 2017-09-03 VITALS — BP 117/88 | HR 88 | Wt 184.0 lb

## 2017-09-03 DIAGNOSIS — E039 Hypothyroidism, unspecified: Secondary | ICD-10-CM

## 2017-09-03 DIAGNOSIS — Z3480 Encounter for supervision of other normal pregnancy, unspecified trimester: Secondary | ICD-10-CM

## 2017-09-03 DIAGNOSIS — O162 Unspecified maternal hypertension, second trimester: Secondary | ICD-10-CM

## 2017-09-03 NOTE — Progress Notes (Signed)
   PRENATAL VISIT NOTE  Subjective:  Amy Wagner is a 27 y.o. G2P1001 at 2415w5d being seen today for ongoing prenatal care.  She is currently monitored for the following issues for this low-risk pregnancy and has Hypothyroid; Supervision of normal pregnancy; Family history of diabetes mellitus in mother; and Hypertension affecting pregnancy in second trimester on their problem list.  Patient reports no complaints.  Contractions: Not present. Vag. Bleeding: None.  Movement: Present. Denies leaking of fluid.   The following portions of the patient's history were reviewed and updated as appropriate: allergies, current medications, past family history, past medical history, past social history, past surgical history and problem list. Problem list updated.  Objective:   Vitals:   09/03/17 0916  BP: 117/88  Pulse: 88  Weight: 184 lb (83.5 kg)    Fetal Status: Fetal Heart Rate (bpm): 147 Fundal Height: 38 cm Movement: Present     General:  Alert, oriented and cooperative. Patient is in no acute distress.  Skin: Skin is warm and dry. No rash noted.   Cardiovascular: Normal heart rate noted  Respiratory: Normal respiratory effort, no problems with respiration noted  Abdomen: Soft, gravid, appropriate for gestational age.  Pain/Pressure: Present     Pelvic: Cervical exam deferred        Extremities: Normal range of motion.  Edema: Trace  Mental Status:  Normal mood and affect. Normal behavior. Normal judgment and thought content.   Assessment and Plan:  Pregnancy: G2P1001 at 615w5d  1. Hypothyroidism, unspecified type -TSH on 08/05/2017 was normal, currently taking 50mcg Levothyroxine, continue current dose.   2. Supervision of other normal pregnancy, antepartum -Routine care -Discussed contraception options for after delivery, patient is interested in trying hormonal IUD.   3. Hypertension affecting pregnancy in second trimester -BP today normal, continue to monitor  Term  labor symptoms and general obstetric precautions including but not limited to vaginal bleeding, contractions, leaking of fluid and fetal movement were reviewed in detail with the patient. Please refer to After Visit Summary for other counseling recommendations.  Return in about 1 week (around 09/10/2017).   Felicie MornHannah E Smith, Medical Student   I confirm that I have verified the information documented in the student's note and that I have also personally reperformed the physical exam and all medical decision making activities.  Luna KitchensKathryn Kooistra CNM

## 2017-09-03 NOTE — Patient Instructions (Signed)

## 2017-09-10 ENCOUNTER — Ambulatory Visit (INDEPENDENT_AMBULATORY_CARE_PROVIDER_SITE_OTHER): Payer: Medicaid Other | Admitting: Student

## 2017-09-10 ENCOUNTER — Encounter: Payer: Medicaid Other | Admitting: Student

## 2017-09-10 VITALS — BP 118/88 | HR 104 | Wt 182.5 lb

## 2017-09-10 DIAGNOSIS — O162 Unspecified maternal hypertension, second trimester: Secondary | ICD-10-CM

## 2017-09-10 DIAGNOSIS — Z3480 Encounter for supervision of other normal pregnancy, unspecified trimester: Secondary | ICD-10-CM

## 2017-09-10 DIAGNOSIS — E039 Hypothyroidism, unspecified: Secondary | ICD-10-CM

## 2017-09-10 NOTE — Progress Notes (Signed)
[employer] interpreter [phone]

## 2017-09-10 NOTE — Progress Notes (Signed)
   PRENATAL VISIT NOTE  Subjective:  Amy Wagner is a 27 y.o. G2P1001 at 8944w5d being seen today for ongoing prenatal care.  She is currently monitored for the following issues for this low-risk pregnancy and has Hypothyroid; Supervision of normal pregnancy; Family history of diabetes mellitus in mother; and Hypertension affecting pregnancy in second trimester on their problem list.  Patient reports occasional contractions.  Contractions: Irritability. Vag. Bleeding: None.  Movement: Present. Denies leaking of fluid.   The following portions of the patient's history were reviewed and updated as appropriate: allergies, current medications, past family history, past medical history, past social history, past surgical history and problem list. Problem list updated.  Objective:   Vitals:   09/10/17 1525  BP: 118/88  Pulse: (!) 104  Weight: 182 lb 8 oz (82.8 kg)    Fetal Status: Fetal Heart Rate (bpm): 135 Fundal Height: 38 cm Movement: Present  Presentation: Vertex  General:  Alert, oriented and cooperative. Patient is in no acute distress.  Skin: Skin is warm and dry. No rash noted.   Cardiovascular: Normal heart rate noted  Respiratory: Normal respiratory effort, no problems with respiration noted  Abdomen: Soft, gravid, appropriate for gestational age.  Pain/Pressure: Present     Pelvic: Cervical exam performed Dilation: 2.5 Effacement (%): 50   Anterior  Extremities: Normal range of motion.  Edema: None  Mental Status:  Normal mood and affect. Normal behavior. Normal judgment and thought content.   Assessment and Plan:  Pregnancy: G2P1001 at 7744w5d  1. Supervision of other normal pregnancy, antepartum -Doing well, routine care  2. Hypothyroidism, unspecified type -Patient to continue current dose 50mcg Levothyroxine  3. Hypertension affecting pregnancy in second trimester -BP today normal   Term labor symptoms and general obstetric precautions including but not  limited to vaginal bleeding, contractions, leaking of fluid and fetal movement were reviewed in detail with the patient. Please refer to After Visit Summary for other counseling recommendations.  Return for LROB and NSt/BPP next Mon, Tues or Wed.   Felicie MornHannah E Smith, Medical Student   . CNM attestation:  I have seen and examined this patient and agree with above documentation in the students's note.   Amy Wagner is a 27 y.o. G2P1001 at 5093w6d reporting +FM, denies LOF, VB, contractions, vaginal discharge.  PE: Gen: calm comfortable, NAD Resp: normal effort, no distress Heart: Regular rate Abd: Soft, NT, gravid, S=D  ROS, labs, PMH reviewed  No orders of the defined types were placed in this encounter.  No orders of the defined types were placed in this encounter.   Assessment: 1. Supervision of other normal pregnancy, antepartum   2. Hypothyroidism, unspecified type   3. Hypertension affecting pregnancy in second trimester     Plan: -Continue synthroid -Return early next week for antenatal testing; will schedule induction at that visit.  - labor precautions and fetal kick counts. - Return to maternity admissions if vaginal bleeding, leakingn of fluid, decreased FM or other ob-gyn concerns  Marylene LandKooistra, Prudencio Velazco Lorraine, CNM 09/11/2017 9:37 PM

## 2017-09-10 NOTE — Patient Instructions (Signed)

## 2017-09-14 ENCOUNTER — Inpatient Hospital Stay (HOSPITAL_COMMUNITY)
Admission: AD | Admit: 2017-09-14 | Discharge: 2017-09-16 | DRG: 807 | Disposition: A | Discharge status: Home / Self Care | Payer: Medicaid Other | Source: Ambulatory Visit | Attending: Obstetrics & Gynecology | Admitting: Obstetrics & Gynecology

## 2017-09-14 ENCOUNTER — Other Ambulatory Visit: Payer: Self-pay

## 2017-09-14 ENCOUNTER — Encounter (HOSPITAL_COMMUNITY): Payer: Self-pay

## 2017-09-14 ENCOUNTER — Encounter: Payer: Self-pay | Admitting: Advanced Practice Midwife

## 2017-09-14 ENCOUNTER — Ambulatory Visit (INDEPENDENT_AMBULATORY_CARE_PROVIDER_SITE_OTHER): Payer: Medicaid Other | Admitting: *Deleted

## 2017-09-14 ENCOUNTER — Ambulatory Visit: Payer: Self-pay

## 2017-09-14 ENCOUNTER — Ambulatory Visit (INDEPENDENT_AMBULATORY_CARE_PROVIDER_SITE_OTHER): Payer: Medicaid Other | Admitting: Advanced Practice Midwife

## 2017-09-14 VITALS — BP 130/87 | HR 91 | Wt 185.3 lb

## 2017-09-14 DIAGNOSIS — O99284 Endocrine, nutritional and metabolic diseases complicating childbirth: Secondary | ICD-10-CM | POA: Diagnosis not present

## 2017-09-14 DIAGNOSIS — Z3483 Encounter for supervision of other normal pregnancy, third trimester: Secondary | ICD-10-CM | POA: Diagnosis present

## 2017-09-14 DIAGNOSIS — O480 Post-term pregnancy: Secondary | ICD-10-CM | POA: Diagnosis present

## 2017-09-14 DIAGNOSIS — Z3A40 40 weeks gestation of pregnancy: Secondary | ICD-10-CM

## 2017-09-14 DIAGNOSIS — E039 Hypothyroidism, unspecified: Secondary | ICD-10-CM | POA: Diagnosis present

## 2017-09-14 DIAGNOSIS — Z3480 Encounter for supervision of other normal pregnancy, unspecified trimester: Secondary | ICD-10-CM

## 2017-09-14 LAB — CBC
HCT: 42.5 % (ref 36.0–46.0)
Hemoglobin: 14.5 g/dL (ref 12.0–15.0)
MCH: 28.4 pg (ref 26.0–34.0)
MCHC: 34.1 g/dL (ref 30.0–36.0)
MCV: 83.2 fL (ref 78.0–100.0)
Platelets: 184 10*3/uL (ref 150–400)
RBC: 5.11 MIL/uL (ref 3.87–5.11)
RDW: 14.9 % (ref 11.5–15.5)
WBC: 20 10*3/uL — ABNORMAL HIGH (ref 4.0–10.5)

## 2017-09-14 LAB — TYPE AND SCREEN
ABO/RH(D): O POS
Antibody Screen: NEGATIVE

## 2017-09-14 MED ORDER — LIDOCAINE HCL (PF) 1 % IJ SOLN
30.0000 mL | INTRAMUSCULAR | Status: DC | PRN
  Filled 2017-09-14: qty 30

## 2017-09-14 MED ORDER — ONDANSETRON HCL 4 MG PO TABS: 4.0000 mg | ORAL_TABLET | ORAL | Status: DC | PRN

## 2017-09-14 MED ORDER — LEVOTHYROXINE SODIUM 75 MCG PO TABS
37.5000 ug | ORAL_TABLET | Freq: Every day | ORAL | Status: DC
Start: 2017-09-15 — End: 2017-09-16
  Administered 2017-09-15 – 2017-09-16 (×2): 37.5 ug via ORAL
  Filled 2017-09-14 (×2): qty 0.5

## 2017-09-14 MED ORDER — SOD CITRATE-CITRIC ACID 500-334 MG/5ML PO SOLN: 30.0000 mL | ORAL | Status: DC | PRN

## 2017-09-14 MED ORDER — ACETAMINOPHEN 325 MG PO TABS: 650.0000 mg | ORAL_TABLET | ORAL | Status: DC | PRN

## 2017-09-14 MED ORDER — ONDANSETRON HCL 4 MG/2ML IJ SOLN: 4.0000 mg | Freq: Four times a day (QID) | INTRAMUSCULAR | Status: DC | PRN

## 2017-09-14 MED ORDER — OXYTOCIN 40 UNITS IN LACTATED RINGERS INFUSION - SIMPLE MED
2.5000 [IU]/h | INTRAVENOUS | Status: DC
  Administered 2017-09-14: 2.5 [IU]/h via INTRAVENOUS

## 2017-09-14 MED ORDER — DIBUCAINE 1 % RE OINT: 1.0000 "application " | TOPICAL_OINTMENT | RECTAL | Status: DC | PRN

## 2017-09-14 MED ORDER — MISOPROSTOL 200 MCG PO TABS
1000.0000 ug | ORAL_TABLET | Freq: Once | ORAL | Status: AC
  Administered 2017-09-14: 1000 ug via BUCCAL
  Filled 2017-09-14: qty 5

## 2017-09-14 MED ORDER — LACTATED RINGERS IV SOLN: 500.0000 mL | INTRAVENOUS | Status: DC | PRN

## 2017-09-14 MED ORDER — WITCH HAZEL-GLYCERIN EX PADS: 1.0000 "application " | MEDICATED_PAD | CUTANEOUS | Status: DC | PRN

## 2017-09-14 MED ORDER — IBUPROFEN 600 MG PO TABS
600.0000 mg | ORAL_TABLET | Freq: Four times a day (QID) | ORAL | Status: DC
  Administered 2017-09-15 – 2017-09-16 (×6): 600 mg via ORAL
  Filled 2017-09-14 (×7): qty 1

## 2017-09-14 MED ORDER — TETANUS-DIPHTH-ACELL PERTUSSIS 5-2.5-18.5 LF-MCG/0.5 IM SUSP: 0.5000 mL | Freq: Once | INTRAMUSCULAR | Status: DC

## 2017-09-14 MED ORDER — ZOLPIDEM TARTRATE 5 MG PO TABS: 5.0000 mg | ORAL_TABLET | Freq: Every evening | ORAL | Status: DC | PRN

## 2017-09-14 MED ORDER — SIMETHICONE 80 MG PO CHEW: 80.0000 mg | CHEWABLE_TABLET | ORAL | Status: DC | PRN

## 2017-09-14 MED ORDER — ONDANSETRON HCL 4 MG/2ML IJ SOLN: 4.0000 mg | INTRAMUSCULAR | Status: DC | PRN

## 2017-09-14 MED ORDER — SENNOSIDES-DOCUSATE SODIUM 8.6-50 MG PO TABS
2.0000 | ORAL_TABLET | ORAL | Status: DC
  Administered 2017-09-15 – 2017-09-16 (×2): 2 via ORAL
  Filled 2017-09-14 (×2): qty 2

## 2017-09-14 MED ORDER — LACTATED RINGERS IV SOLN
INTRAVENOUS | Status: DC
  Administered 2017-09-14: 21:00:00 via INTRAVENOUS

## 2017-09-14 MED ORDER — OXYTOCIN 40 UNITS IN LACTATED RINGERS INFUSION - SIMPLE MED
INTRAVENOUS | Status: AC
  Filled 2017-09-14: qty 1000

## 2017-09-14 MED ORDER — COCONUT OIL OIL: 1.0000 "application " | TOPICAL_OIL | Status: DC | PRN

## 2017-09-14 MED ORDER — OXYTOCIN BOLUS FROM INFUSION
500.0000 mL | Freq: Once | INTRAVENOUS | Status: AC
  Administered 2017-09-14: 500 mL via INTRAVENOUS

## 2017-09-14 MED ORDER — OXYCODONE-ACETAMINOPHEN 5-325 MG PO TABS: 1.0000 | ORAL_TABLET | ORAL | Status: DC | PRN

## 2017-09-14 MED ORDER — DIPHENHYDRAMINE HCL 25 MG PO CAPS: 25.0000 mg | ORAL_CAPSULE | Freq: Four times a day (QID) | ORAL | Status: DC | PRN

## 2017-09-14 MED ORDER — PRENATAL MULTIVITAMIN CH
1.0000 | ORAL_TABLET | Freq: Every day | ORAL | Status: DC
  Administered 2017-09-15: 1 via ORAL
  Filled 2017-09-14: qty 1

## 2017-09-14 MED ORDER — LIDOCAINE HCL (PF) 1 % IJ SOLN
INTRAMUSCULAR | Status: AC
  Filled 2017-09-14: qty 30

## 2017-09-14 MED ORDER — BENZOCAINE-MENTHOL 20-0.5 % EX AERO: 1.0000 "application " | INHALATION_SPRAY | CUTANEOUS | Status: DC | PRN

## 2017-09-14 MED ORDER — OXYCODONE-ACETAMINOPHEN 5-325 MG PO TABS: 2.0000 | ORAL_TABLET | ORAL | Status: DC | PRN

## 2017-09-14 NOTE — MAU Note (Signed)
Pt reports contractions every few minutes. Denies LOF. Reports some bloody show. Was 3cm in office today.

## 2017-09-14 NOTE — H&P (Signed)
LABOR AND DELIVERY ADMISSION HISTORY AND PHYSICAL NOTE  Amy Wagner is a 27 y.o. female G2P1001 with IUP at 3749w2d by 17w US presenting for SOL.  She reports positive fetal movement. She denies leakage of fluid or vaginal bleeding.  [facility]Com[facility] Pregnancy complications:  - Hypothyroid- on Synthroid  -Hypertension in second trimester, labs normal- no meds   Past Medical History: Past Medical History:  Diagnosis Date  . No known health problems     Past Surgical History: Past Surgical History:  Procedure Laterality Date  . NO PAST SURGERIES      Obstetrical History: OB History    Gravida Para Term Preterm AB Living   2 1 1     1    SAB TAB Ectopic Multiple Live Births           1      Social History: Social History   Socioeconomic History  . Marital status: Married    Spouse name: Not on file  . Number of children: Not on file  . Years of education: Not on file  . Highest education level: Not on file  Social Needs  . Financial resource strain: Not on file  . Food insecurity - worry: Not on file  . Food insecurity - inability: Not on file  . Transportation needs - medical: Not on file  . Transportation needs - non-medical: Not on file  Occupational History  . Not on file  Tobacco Use  . Smoking status: Never Smoker  . Smokeless tobacco: Never Used  Substance and Sexual Activity  . Alcohol use: No  . Drug use: No  . Sexual activity: Not on file  Other Topics Concern  . Not on file  Social History Narrative  . Not on file    Family History: Family History  Problem Relation Age of Onset  . Diabetes Mother   . Hypertension Mother   . Cancer Neg Hx     Allergies: No Known Allergies  Medications Prior to Admission  Medication Sig Dispense Refill Last Dose  . levothyroxine (SYNTHROID, LEVOTHROID) 25 MCG tablet Take 2 tablets (50 mcg total) by mouth daily before breakfast. Take 1 1/2 tablet by mouth daily. 135  tablet 2 Taking  . prenatal vitamin w/FE, FA (PRENATAL 1 + 1) 27-1 MG TABS tablet Take 1 tablet by mouth daily at 12 noon. 30 each 6 Taking     Review of Systems  All systems reviewed and negative except as stated in HPI  Physical Exam Last menstrual period 11/28/2016. General appearance: alert and cooperative Lungs: clear to auscultation bilaterally Heart: regular rate and rhythm Abdomen: soft, non-tender; bowel sounds normal Extremities: No calf swelling or tenderness Presentation: cephalic by cervical exam  Fetal monitoring: 140/ moderate/ +accels/ no decel  Uterine activity: 2-3 minutes/ strong by palpation  Dilation: 7.5 Effacement (%): 100 Station: -1, 0 Exam by:: Camelia Enganielle Simpson RN Membranes: BBOW    Prenatal labs: ABO, Rh: O/Positive/-- (08/30 1344) Antibody: Negative (08/30 1344) Rubella: 2.76 (08/30 1344) RPR: Non Reactive (12/26 0811)  HBsAg: Negative (08/30 1344)  HIV: Non Reactive (12/26 0811)  GC/Chlamydia: Negative (2/20) GBS:   Negative (2/20) 2 hr Glucola: [id number] Genetic screening:  Un-resulted  Anatomy US: Normal Female   [facility] Labs  Dating  irregular LMP; 17w u/s Blood type: O/Positive/-- (08/30 1344)  Genetic Screen 1 Screen:    AFP:     Quad: un-resulted 2/2 lack of information    NIPS:  Antibody:Negative (08/30 1344)  Anatomic [place]  nml female but EDC changed > f/u US to confirm new EDC Rubella: 2.76 (08/30 1344)  GTT Early:               Third trimester: 86/147/135 RPR: Non Reactive (12/26 0811)   Flu vaccine  03/26/17 HBsAg: Negative (08/30 1344)   TDaP vaccine  06/24/17  Rhogam: n/a HIV: Non Reactive (12/26 0811) NR  Baby Food  breast                               GBS: NEGATIVE   Contraception Hormonal IUD Pap: neg 07/04/15  Circumcision  no   Pediatrician  [facility] CF:  Support Person  husband Ramesh SMA  Prenatal Classes 2nd baby, not planning  Hgb electrophoresis:    Prenatal Transfer Tool  Maternal  Diabetes: No Genetic Screening: un resulted  Maternal Ultrasounds/Referrals: Normal Fetal Ultrasounds or other Referrals:  None Maternal Substance Abuse:  No Significant Maternal Medications:  Meds include: Syntroid Significant Maternal Lab Results: Lab values include: Group B Strep negative  Results for orders placed or performed during the hospital encounter of 09/14/17 (from the past 24 hour(s))  CBC   Collection Time: 09/14/17  8:58 PM  Result Value Ref Range   WBC 20.0 (H) 4.0 - 10.5 K/uL   RBC 5.11 3.87 - 5.11 MIL/uL   Hemoglobin 14.5 12.0 - 15.0 g/dL   HCT 16.1 09.6 - 04.5 %   MCV 83.2 78.0 - 100.0 fL   MCH 28.4 26.0 - 34.0 pg   MCHC 34.1 30.0 - 36.0 g/dL   RDW 40.9 81.1 - 91.4 %   Platelets 184 150 - 400 K/uL    Patient Active Problem List   Diagnosis Date Noted  . Hypertension affecting pregnancy in second trimester 04/24/2017  . Supervision of normal pregnancy 02/26/2017  . Hypothyroid 09/22/2016  . Family history of diabetes mellitus in mother 06/13/2015    Assessment: Amy Wagner is a 27 y.o. G2P1001 at [redacted]w[redacted]d here for SOL.  #Labor: Expectant Management- Active labor  #Pain: Plans natural delivery  #FWB: Cat 1 #ID:  GBS neg #MOF: Breast  #MOC: IUD  #Circ:  No   Sharyon Cable, CNM 09/14/2017, 8:57 PM

## 2017-09-14 NOTE — Progress Notes (Signed)
   PRENATAL VISIT NOTE  Subjective:  Amy Wagner is a 27 y.o. G2P1001 at 6328w2d being seen today for ongoing prenatal care.  She is currently monitored for the following issues for this low-risk pregnancy and has Hypothyroid; Supervision of normal pregnancy; Family history of diabetes mellitus in mother; and Hypertension affecting pregnancy in second trimester on their problem list.  Patient reports contractions off and on .  Contractions: Irritability. Vag. Bleeding: None.  Movement: (!) Decreased. Denies leaking of fluid.   The following portions of the patient's history were reviewed and updated as appropriate: allergies, current medications, past family history, past medical history, past social history, past surgical history and problem list. Problem list updated.  Objective:   Vitals:   09/14/17 1336  BP: 130/87  Pulse: 91  Weight: 185 lb 4.8 oz (84.1 kg)    Fetal Status: Fetal Heart Rate (bpm): 133 Fundal Height: 36 cm Movement: (!) Decreased  Presentation: Vertex  General:  Alert, oriented and cooperative. Patient is in no acute distress.  Skin: Skin is warm and dry. No rash noted.   Cardiovascular: Normal heart rate noted  Respiratory: Normal respiratory effort, no problems with respiration noted  Abdomen: Soft, gravid, appropriate for gestational age.  Pain/Pressure: Present     Pelvic: Cervical exam performed Dilation: 3 Effacement (%): 90 Station: -1  Extremities: Normal range of motion.  Edema: None  Mental Status:  Normal mood and affect. Normal behavior. Normal judgment and thought content.    NST:  FHT: 135, moderate with 15x15 accels, no decels Toco: occasional contraction with some UI  BPP: 8/8  Total 10/10   Assessment and Plan:  Pregnancy: G2P1001 at 528w2d  1. Supervision of other normal pregnancy, antepartum   2. Post-term pregnancy, 40-42 weeks of gestation - IOL scheduled for mn on 3/24 - BPP/NST today - membranes swept - Admission and  IOL orders placed   Term labor symptoms and general obstetric precautions including but not limited to vaginal bleeding, contractions, leaking of fluid and fetal movement were reviewed in detail with the patient. Please refer to After Visit Summary for other counseling recommendations.  Return in about 6 weeks (around 10/26/2017).   Thressa ShellerHeather Xiomara Sevillano, CNM

## 2017-09-14 NOTE — Patient Instructions (Addendum)
Return at 11:45pm on 09/19/17 for induction of labor if you have not gone into labor prior to that time.   Etonogestrel implant What is this medicine? ETONOGESTREL (et oh noe JES trel) is a contraceptive (birth control) device. It is used to prevent pregnancy. It can be used for up to 3 years. This medicine may be used for other purposes; ask your health care provider or pharmacist if you have questions. COMMON BRAND NAME(S): Implanon, Nexplanon What should I tell my health care provider before I take this medicine? They need to know if you have any of these conditions: -abnormal vaginal bleeding -blood vessel disease or blood clots -cancer of the breast, cervix, or liver -depression -diabetes -gallbladder disease -headaches -heart disease or recent heart attack -high blood pressure -high cholesterol -kidney disease -liver disease -renal disease -seizures -tobacco smoker -an unusual or allergic reaction to etonogestrel, other hormones, anesthetics or antiseptics, medicines, foods, dyes, or preservatives -pregnant or trying to get pregnant -breast-feeding How should I use this medicine? This device is inserted just under the skin on the inner side of your upper arm by a health care professional. Talk to your pediatrician regarding the use of this medicine in children. Special care may be needed. Overdosage: If you think you have taken too much of this medicine contact a poison control center or emergency room at once. NOTE: This medicine is only for you. Do not share this medicine with others. What if I miss a dose? This does not apply. What may interact with this medicine? Do not take this medicine with any of the following medications: -amprenavir -bosentan -fosamprenavir This medicine may also interact with the following medications: -barbiturate medicines for inducing sleep or treating seizures -certain medicines for fungal infections like ketoconazole and  itraconazole -grapefruit juice -griseofulvin -medicines to treat seizures like carbamazepine, felbamate, oxcarbazepine, phenytoin, topiramate -modafinil -phenylbutazone -rifampin -rufinamide -some medicines to treat HIV infection like atazanavir, indinavir, lopinavir, nelfinavir, tipranavir, ritonavir -St. John's wort This list may not describe all possible interactions. Give your health care provider a list of all the medicines, herbs, non-prescription drugs, or dietary supplements you use. Also tell them if you smoke, drink alcohol, or use illegal drugs. Some items may interact with your medicine. What should I watch for while using this medicine? This product does not protect you against HIV infection (AIDS) or other sexually transmitted diseases. You should be able to feel the implant by pressing your fingertips over the skin where it was inserted. Contact your doctor if you cannot feel the implant, and use a non-hormonal birth control method (such as condoms) until your doctor confirms that the implant is in place. If you feel that the implant may have broken or become bent while in your arm, contact your healthcare provider. What side effects may I notice from receiving this medicine? Side effects that you should report to your doctor or health care professional as soon as possible: -allergic reactions like skin rash, itching or hives, swelling of the face, lips, or tongue -breast lumps -changes in emotions or moods -depressed mood -heavy or prolonged menstrual bleeding -pain, irritation, swelling, or bruising at the insertion site -scar at site of insertion -signs of infection at the insertion site such as fever, and skin redness, pain or discharge -signs of pregnancy -signs and symptoms of a blood clot such as breathing problems; changes in vision; chest pain; severe, sudden headache; pain, swelling, warmth in the leg; trouble speaking; sudden numbness or weakness of the face, arm  or  leg -signs and symptoms of liver injury like dark yellow or brown urine; general ill feeling or flu-like symptoms; light-colored stools; loss of appetite; nausea; right upper belly pain; unusually weak or tired; yellowing of the eyes or skin -unusual vaginal bleeding, discharge -signs and symptoms of a stroke like changes in vision; confusion; trouble speaking or understanding; severe headaches; sudden numbness or weakness of the face, arm or leg; trouble walking; dizziness; loss of balance or coordination Side effects that usually do not require medical attention (report to your doctor or health care professional if they continue or are bothersome): -acne -back pain -breast pain -changes in weight -dizziness -general ill feeling or flu-like symptoms -headache -irregular menstrual bleeding -nausea -sore throat -vaginal irritation or inflammation This list may not describe all possible side effects. Call your doctor for medical advice about side effects. You may report side effects to FDA at 1-800-FDA-1088. Where should I keep my medicine? This drug is given in a hospital or clinic and will not be stored at home. NOTE: This sheet is a summary. It may not cover all possible information. If you have questions about this medicine, talk to your doctor, pharmacist, or health care provider.  2018 Elsevier/Gold Standard (2016-01-03 11:19:22)

## 2017-09-14 NOTE — Progress Notes (Signed)

## 2017-09-15 LAB — ABO/RH: ABO/RH(D): O POS

## 2017-09-15 LAB — BIRTH TISSUE RECOVERY COLLECTION (PLACENTA DONATION)

## 2017-09-15 LAB — RPR: RPR Ser Ql: NONREACTIVE

## 2017-09-15 NOTE — Progress Notes (Signed)
POSTPARTUM PROGRESS NOTE  Post Partum Day 1  Subjective:  Amy Wagner is a 27 y.o. [id number] s/p SVD at 5442w2d.  No acute events overnight.  Pt denies problems with ambulating, voiding or po intake.  Pain is well-controlled and lochia is appropriate. Only concern she has this morning is with regards to breastfeeding. She is worried that her milk has not come in, and wanting to supplement with bottle d/t concern that baby is not getting any milk.  Objective: Blood pressure 116/66, pulse 85, temperature 98.5 F (36.9 C), temperature source Oral, resp. rate 18, height 5\' 2"  (1.575 m), weight 185 lb 4.8 oz (84.1 kg), last menstrual period 11/28/2016, unknown if currently breastfeeding.  Physical Exam:  General: alert, cooperative and no distress Chest: no respiratory distress Heart:regular rate, distal pulses intact Abdomen: soft, nontender,  Uterine Fundus: firm, appropriately tender DVT Evaluation: No calf swelling or tenderness Extremities: no edema Skin: warm, dry  Recent Labs    09/14/17 2058  HGB 14.5  HCT 42.5    Assessment/Plan: Amy Wagner is a 27 y.o. [id number] s/p SVD at 1542w2d   PPD#1 - Doing well, VS wnl. Routine PP care Contraception: IUD vs Nexplanon Feeding: breast -- spent significant time educating patient on breastfeeding and when to expect milk to come in. Lactation consult also palced Dispo: Plan for discharge tomorrow.   LOS: 1 day   Kandra NicolasJulie P DegeleMD 09/15/2017, 8:46 AM

## 2017-09-16 MED ORDER — IBUPROFEN 600 MG PO TABS: 600.0000 mg | ORAL_TABLET | Freq: Four times a day (QID) | ORAL | 0 refills | Status: DC

## 2017-09-16 NOTE — Discharge Summary (Signed)
OB Discharge Summary     Patient Name: Amy Wagner DOB: 24-Apr-1991 MRN: [id number]  Date of admission: 09/14/2017 Delivering MD: Sharyon Cable   Date of discharge: 09/16/2017  Admitting diagnosis: 40WKS CTX Intrauterine pregnancy: [redacted]w[redacted]d     Secondary diagnosis:  Active Problems:   NSVD (normal spontaneous vaginal delivery)  Additional problems: none     Discharge diagnosis: Term Pregnancy Delivered                                                                                                Post partum procedures:none  Augmentation: none  Complications: None  Hospital course:  Onset of Labor With Vaginal Delivery     27 y.o. yo [id number] at [redacted]w[redacted]d was admitted in Active Labor on 09/14/2017. Patient had an uncomplicated labor course as follows:  Membrane Rupture Time/Date: 9:08 PM ,09/14/2017   Intrapartum Procedures: Episiotomy: None [1]                                         Lacerations:  1st degree [2]   Had to get Cytotec postpartum with good relief of bleeding Patient had a delivery of a Viable infant. 09/14/2017  Information for the patient's newborn:  Riki, Berninger [[id number]]  Delivery Method: Vag-Spont    Pateint had an uncomplicated postpartum course.  She is ambulating, tolerating a regular diet, passing flatus, and urinating well. Patient is discharged home in stable condition on 09/16/17.   Physical exam  Vitals:   09/15/17 0442 09/15/17 1300 09/15/17 1746 09/16/17 0633  BP: 116/66 97/60 109/74 107/70  Pulse: 85 75 77 73  Resp: 18 16 18 16   Temp: 98.5 F (36.9 C) (!) 97.4 F (36.3 C) 98.5 F (36.9 C) (!) 97.4 F (36.3 C)  TempSrc: Oral Oral Oral Axillary  SpO2:  99% 99%   Weight:      Height:       General: alert, cooperative and no distress Lochia: appropriate Uterine Fundus: firm Incision: Healing well with no significant drainage DVT Evaluation: No evidence of DVT seen on physical exam. Labs: Lab Results   Component Value Date   WBC 20.0 (H) 09/14/2017   HGB 14.5 09/14/2017   HCT 42.5 09/14/2017   MCV 83.2 09/14/2017   PLT 184 09/14/2017   CMP Latest Ref Rng & Units 04/24/2017  Glucose 65 - 99 mg/dL 89  BUN 6 - 20 mg/dL 5(L)  Creatinine 1.30 - 1.00 mg/dL 8.65(H)  Sodium 846 - 962 mmol/L 138  Potassium 3.5 - 5.2 mmol/L 4.6  Chloride 96 - 106 mmol/L 104  CO2 20 - 29 mmol/L 20  Calcium 8.7 - 10.2 mg/dL 95.2  Total Protein 6.0 - 8.5 g/dL 7.4  Total Bilirubin 0.0 - 1.2 mg/dL 0.2  Alkaline Phos 39 - 117 IU/L 97  AST 0 - 40 IU/L 14  ALT 0 - 32 IU/L 12    Discharge instruction: per After Visit Summary and "Baby and Me Booklet".  After visit meds:  Allergies  as of 09/16/2017   No Known Allergies     Medication List    TAKE these medications   ibuprofen 600 MG tablet Commonly known as:  ADVIL,MOTRIN Take 1 tablet (600 mg total) by mouth every 6 (six) hours.   levothyroxine 25 MCG tablet Commonly known as:  SYNTHROID, LEVOTHROID Take 2 tablets (50 mcg total) by mouth daily before breakfast. Take 1 1/2 tablet by mouth daily. What changed:  additional instructions   prenatal vitamin w/FE, FA 27-1 MG Tabs tablet Take 1 tablet by mouth daily at 12 noon.       Diet: routine diet  Activity: Advance as tolerated. Pelvic rest for 6 weeks.   Outpatient follow up:4 weeks Follow up Appt: Future Appointments  Date Time Provider Department Center  09/25/2017 10:15 AM [facility] NURSE [facility]  10/12/2017  4:40 PM Armando ReichertHogan, Heather D, CNM [facility]   Follow up Visit:No Follow-up on file.  Postpartum contraception: IUD Mirena  Newborn Data: Live born female  Birth Weight: 7 lb 4.1 oz (3291 g) APGAR: 8, 9  Newborn Delivery   Birth date/time:  09/14/2017 21:15:00 Delivery type:  Vaginal, Spontaneous     Baby Feeding: Breast Disposition:home with mother   09/16/2017 Wynelle BourgeoisMarie Blimi Godby, CNM

## 2017-09-16 NOTE — Lactation Note (Addendum)
This note was copied from a baby's chart. Lactation Consultation Note Mom denied need for interpreter Baby 28 hrs old. Mom BF/formula feeding until her milk comes in. Mom's 2nd child. Mom has 5319 month old at home that she BF for 13 months. Mom stated that she formula/BF her 1st child until her mil;k comes in. Discussed consistency of colostrum, importance, supply and demand. Mom is using DEBP. Mom states only got a few drops. Praised mom for pumping. Mom was trying to BF baby but baby was sleepy and not interested in BF. Mom states that she BF then formula fed, then baby crying acting hungry again, but he wont take the breast right now, but will not sleep unless she is holding baby.  Educated on newborn behavior and feeding habits. Encouraged to assess breast before and after BF for transfer to assure mom baby got some colostrum. Encouraged to call for assistance. WH/LC brochure given w/resources, support groups and LC services.  Patient Name: Boy Lottie MusselSharmila Sharma-Poudel ZOXWR'UToday's Date: 09/16/2017 Reason for consult: Initial assessment   Maternal Data Has patient been taught Hand Expression?: Yes Does the patient have breastfeeding experience prior to this delivery?: Yes  Feeding Feeding Type: Formula Nipple Type: Slow - flow Length of feed: 20 min  LATCH Score Latch: Grasps breast easily, tongue down, lips flanged, rhythmical sucking.  Audible Swallowing: A few with stimulation  Type of Nipple: Everted at rest and after stimulation(short shaft)  Comfort (Breast/Nipple): Soft / non-tender  Hold (Positioning): No assistance needed to correctly position infant at breast.  LATCH Score: 9  Interventions Interventions: Breast feeding basics reviewed;Support pillows;DEBP;Breast compression  Lactation Tools Discussed/Used WIC Program: Yes Initiated by:: rn Date initiated:: 09/15/17   Consult Status Consult Status: Follow-up Date: 09/17/17 Follow-up type: In-patient    Mekaila Tarnow,  Diamond NickelLAURA G 09/16/2017, 1:17 AM

## 2017-09-16 NOTE — Lactation Note (Signed)
This note was copied from a baby's chart. Lactation Consultation Note  Patient Name: Amy Lottie MusselSharmila Sharma-Poudel RUEAV'WToday's Date: 09/16/2017  Baby currently on breast and mom comfortable.  Parents are also giving supplemental formula bottles after breastfeeding.  Stressed importance of feeding frequently at breast to stimulate supply.  Reviewed engorgement treatment.  Mom plans on seeing the LC at pediatricians office who assisted her with first baby.  Lactation services and support reviewed and encouraged.   Maternal Data    Feeding Feeding Type: Bottle Fed - Formula Nipple Type: Slow - flow Length of feed: 10 min  LATCH Score                   Interventions    Lactation Tools Discussed/Used     Consult Status      Huston FoleyMOULDEN, Shawnise Peterkin S 09/16/2017, 9:35 AM

## 2017-09-16 NOTE — Discharge Instructions (Signed)

## 2017-09-20 ENCOUNTER — Inpatient Hospital Stay (HOSPITAL_COMMUNITY)
Admission: RE | Admit: 2017-09-20 | Payer: Medicaid Other | Source: Ambulatory Visit | Admitting: Obstetrics and Gynecology

## 2017-09-21 ENCOUNTER — Encounter: Payer: Self-pay | Admitting: *Deleted

## 2017-09-25 ENCOUNTER — Ambulatory Visit: Payer: Medicaid Other

## 2017-09-28 ENCOUNTER — Ambulatory Visit (INDEPENDENT_AMBULATORY_CARE_PROVIDER_SITE_OTHER): Payer: Medicaid Other | Admitting: General Practice

## 2017-09-28 VITALS — BP 109/72 | HR 84 | Ht 62.0 in | Wt 171.0 lb

## 2017-09-28 DIAGNOSIS — Z0130 Encounter for examination of blood pressure without abnormal findings: Secondary | ICD-10-CM

## 2017-09-28 NOTE — Progress Notes (Signed)
Patient here for BP check today- delivered vaginally 2 weeks ago. Patient denies headaches, dizziness, or blurry vision. Patient is not on BP medication. No edema noted. BP WNL- will follow up at scheduled pp visit. Patient had no questions

## 2017-09-28 NOTE — Progress Notes (Signed)
I have reviewed the nurse's note, and agree with the plan of care.  Thressa ShellerHeather Laylynn Campanella 9:56 AM 09/28/17

## 2017-10-12 ENCOUNTER — Ambulatory Visit (INDEPENDENT_AMBULATORY_CARE_PROVIDER_SITE_OTHER): Payer: Medicaid Other | Admitting: Advanced Practice Midwife

## 2017-10-12 ENCOUNTER — Encounter: Payer: Self-pay | Admitting: Advanced Practice Midwife

## 2017-10-12 VITALS — BP 116/77 | HR 83 | Wt 171.3 lb

## 2017-10-12 DIAGNOSIS — E039 Hypothyroidism, unspecified: Secondary | ICD-10-CM

## 2017-10-12 DIAGNOSIS — Z3202 Encounter for pregnancy test, result negative: Secondary | ICD-10-CM

## 2017-10-12 LAB — POCT PREGNANCY, URINE: Preg Test, Ur: NEGATIVE

## 2017-10-12 MED ORDER — LEVOTHYROXINE SODIUM 50 MCG PO TABS: 50.0000 ug | ORAL_TABLET | Freq: Every day | ORAL | 0 refills | Status: DC

## 2017-10-12 MED ORDER — PRENATAL VITAMINS 0.8 MG PO TABS: 1.0000 | ORAL_TABLET | Freq: Every day | ORAL | 12 refills | Status: DC

## 2017-10-12 NOTE — Progress Notes (Signed)
Subjective:     Amy Wagner is a 27 y.o. female who presents for a postpartum visit. She is 4 weeks postpartum following a spontaneous vaginal delivery. I have fully reviewed the prenatal and intrapartum course. The delivery was at 40w 2d gestational weeks. Outcome: spontaneous vaginal delivery. Anesthesia: none. Postpartum course has been uncomplicated. Baby's course has been uncomplicated. Baby is feeding by breast. Bleeding no bleeding. Bowel function is normal. Bladder function is normal. Patient is sexually active. Contraception method is none. Postpartum depression screening: negative.  The following portions of the patient's history were reviewed and updated as appropriate: allergies, current medications, past family history, past medical history, past social history, past surgical history and problem list.  Review of Systems Pertinent items are noted in HPI. Pertinent items noted in HPI and remainder of comprehensive ROS otherwise negative.   Objective:    There were no vitals taken for this visit.  General:  alert and cooperative   Breasts:  negative  Lungs: clear to auscultation bilaterally  Heart:  regular rate and rhythm  Abdomen: soft, non-tender; bowel sounds normal; no masses,  no organomegaly   Vulva:  normal  Vagina: normal vagina  Cervix:  multiparous appearance  Corpus: normal  Adnexa:  not evaluated  Rectal Exam: Not performed.         Results for orders placed or performed in visit on 10/12/17 (from the past 24 hour(s))  Pregnancy, urine POC     Status: None   Collection Time: 10/12/17  5:21 PM  Result Value Ref Range   Preg Test, Ur NEGATIVE NEGATIVE    GYNECOLOGY OFFICE PROCEDURE NOTE  Amy Wagner is a 27 y.o. [id number] here for Liletta IUD insertion. No GYN concerns.  Last pap smear was 2017 and was normal.  IUD Insertion Procedure Note Patient identified, informed consent performed, consent signed.   Discussed risks of irregular  bleeding, cramping, infection, malpositioning or misplacement of the IUD outside the uterus which may require further procedure such as laparoscopy. Time out was performed.  Urine pregnancy test negative.  Speculum placed in the vagina.  Cervix visualized.  Cleaned with Betadine x 2.  Grasped anteriorly with a single tooth tenaculum.  Uterus sounded to 9 cm.  Liletta IUD placed per manufacturer's recommendations.  Strings trimmed to 3 cm. Tenaculum was removed, good hemostasis noted.  Patient tolerated procedure well.   Patient was given post-procedure instructions.  She was advised to have backup contraception for one week.  Patient was also asked to check IUD strings periodically and follow up in 4 weeks for IUD check.  Assessment:     Normal postpartum exam. Pap smear not done at today's visit.   Plan:    1. Contraception: IUD 2. TSH today, and refill for levothyroxine. Will adjust as needed.  3. Follow up in: 4 weeks for IUD check  or as needed.

## 2017-10-13 ENCOUNTER — Other Ambulatory Visit: Payer: Self-pay | Admitting: Advanced Practice Midwife

## 2017-10-13 ENCOUNTER — Encounter: Payer: Self-pay | Admitting: *Deleted

## 2017-10-13 LAB — TSH: TSH: 0.291 u[IU]/mL — AB (ref 0.450–4.500)

## 2017-10-13 MED ORDER — LEVOTHYROXINE SODIUM 25 MCG PO TABS: 37.5000 ug | ORAL_TABLET | Freq: Every day | ORAL | 1 refills | Status: DC

## 2017-10-13 NOTE — Progress Notes (Signed)
Per consult with Dr. Adrian BlackwaterStinson, will decrease dose back to pre-pregnancy dose of 37.875mcg. Message sent to patient and rx sent to pharmacy.  Amy Wagner 3:53 PM 10/13/17

## 2017-11-03 ENCOUNTER — Ambulatory Visit (INDEPENDENT_AMBULATORY_CARE_PROVIDER_SITE_OTHER): Payer: Medicaid Other | Admitting: Nurse Practitioner

## 2017-11-03 ENCOUNTER — Encounter: Payer: Self-pay | Admitting: Nurse Practitioner

## 2017-11-03 VITALS — BP 116/81 | HR 100 | Wt 172.9 lb

## 2017-11-03 DIAGNOSIS — N939 Abnormal uterine and vaginal bleeding, unspecified: Secondary | ICD-10-CM | POA: Diagnosis not present

## 2017-11-03 DIAGNOSIS — Z30432 Encounter for removal of intrauterine contraceptive device: Secondary | ICD-10-CM

## 2017-11-03 DIAGNOSIS — Z30011 Encounter for initial prescription of contraceptive pills: Secondary | ICD-10-CM | POA: Diagnosis not present

## 2017-11-03 MED ORDER — NORETHINDRONE 0.35 MG PO TABS: 1.0000 | ORAL_TABLET | Freq: Every day | ORAL | 11 refills | Status: DC

## 2017-11-03 NOTE — Patient Instructions (Signed)
If you stop breastfeeding, you will need a change in the pill you are taking. Call the office if you continue to have problems with vaginal bleeding.

## 2017-11-03 NOTE — Progress Notes (Signed)
   GYNECOLOGY OFFICE VISIT NOTE   History:  27 y.o. [id number] here today for IUD removal due to daily vaginal bleeding. She thinks the IUD is coming out - she feels something in the vagina besides the IUD threads and is sure it is the IUD coming out.  Wants pills instead of the IUD.  Past Medical History:  Diagnosis Date  . No known health problems     Past Surgical History:  Procedure Laterality Date  . NO PAST SURGERIES      The following portions of the patient's history were reviewed and updated as appropriate: allergies, current medications, past family history, past medical history, past social history, past surgical history and problem list.    Review of Systems:  Pertinent items noted in HPI and remainder of comprehensive ROS otherwise negative.  Objective:  Physical Exam BP 116/81 (BP Location: Left Arm, Patient Position: Sitting, Cuff Size: Normal)   Pulse 100   Wt 172 lb 14.4 oz (78.4 kg)   Breastfeeding? Yes   BMI 31.62 kg/m  CONSTITUTIONAL: Well-developed, well-nourished female in no acute distress.  NECK: Normal range of motion, supple, no masses SKIN: Skin is warm and dry. No rash noted. Not diaphoretic. No erythema. No pallor. NEUROLOGIC: Alert and oriented to person, place, and time. Normal muscle tone coordination. No cranial nerve deficit noted. PSYCHIATRIC: Normal mood and affect. Normal behavior. Normal judgment and thought content. ABDOMEN: Soft, no distention noted.   PELVIC: Normal appearing external genitalia; normal appearing vaginal mucosa and cervix.  No abnormal discharge noted.  Normal uterine size, no other palpable masses, no uterine or adnexal tenderness.  IUD strings seen in vagina.  Small amount of vaginal bleeding.   MUSCULOSKELETAL: Normal range of motion. No edema noted.  Discussed with client that the vaginal bleeding could be handled and stopped.  Advised her that the IUD is in the place it needs to be but she is not comfortable with the  IUD in her body and wants it removed.  Consent form signed and IUD removed intact in pelvic exam.  Tolerated well by client and she was very grateful to have it out.  Labs and Imaging No results found.  Assessment & Plan:  1. Abnormal vaginal bleeding Even ofter lots of counseling, client insists on IUD removal.  Consent signed and IUD removed.  2. Encounter for initial prescription of contraceptive pills Micronor prescribed and instruction given to client to change pills if she stops breastfeeding or at the time the baby is taking other food besides breastmilk.  Advised that Micronor is a one hormone pill and it is the least effective pill.  Advised to take it daily at the same time of day.  Client indicates understanding.  3.  IUD removal    Please refer to After Visit Summary for other counseling recommendations.   Return in about 1 year (around 11/04/2018).   Total face-to-face time with patient: 25 minutes.  Over 50% of encounter was spent on counseling and coordination of care.  Nolene Bernheim, RN, MSN, NP-BC Nurse Practitioner, [facility] for [employer], [facility] 11/03/2017 3:37 PM

## 2017-11-10 ENCOUNTER — Ambulatory Visit: Payer: Medicaid Other | Admitting: Medical

## 2017-11-16 ENCOUNTER — Encounter: Payer: Self-pay | Admitting: *Deleted

## 2018-02-05 ENCOUNTER — Ambulatory Visit: Payer: BLUE CROSS/BLUE SHIELD | Admitting: Family Medicine

## 2018-02-05 ENCOUNTER — Other Ambulatory Visit: Payer: Self-pay | Admitting: Family Medicine

## 2018-02-05 ENCOUNTER — Encounter: Payer: Self-pay | Admitting: Family Medicine

## 2018-02-05 VITALS — BP 112/68 | HR 66 | Temp 98.1°F | Ht 62.0 in | Wt 166.1 lb

## 2018-02-05 DIAGNOSIS — R5383 Other fatigue: Secondary | ICD-10-CM

## 2018-02-05 DIAGNOSIS — E039 Hypothyroidism, unspecified: Secondary | ICD-10-CM | POA: Diagnosis not present

## 2018-02-05 DIAGNOSIS — M549 Dorsalgia, unspecified: Secondary | ICD-10-CM

## 2018-02-05 DIAGNOSIS — L309 Dermatitis, unspecified: Secondary | ICD-10-CM

## 2018-02-05 LAB — CBC
HEMATOCRIT: 41.2 % (ref 36.0–46.0)
Hemoglobin: 14 g/dL (ref 12.0–15.0)
MCHC: 34.1 g/dL (ref 30.0–36.0)
MCV: 85.3 fl (ref 78.0–100.0)
Platelets: 231 10*3/uL (ref 150.0–400.0)
RBC: 4.82 Mil/uL (ref 3.87–5.11)
RDW: 13.7 % (ref 11.5–15.5)
WBC: 10 10*3/uL (ref 4.0–10.5)

## 2018-02-05 LAB — T4, FREE: FREE T4: 0.87 ng/dL (ref 0.60–1.60)

## 2018-02-05 LAB — TSH: TSH: 2.47 u[IU]/mL (ref 0.35–4.50)

## 2018-02-05 MED ORDER — CLOBETASOL PROP EMOLLIENT BASE 0.05 % EX CREA: TOPICAL_CREAM | CUTANEOUS | 0 refills | Status: DC

## 2018-02-05 MED ORDER — LEVOTHYROXINE SODIUM 25 MCG PO TABS: 37.5000 ug | ORAL_TABLET | Freq: Every day | ORAL | 2 refills | Status: DC

## 2018-02-05 NOTE — Patient Instructions (Addendum)
Give us 2-3 business days to get the results of your labs back.  OK to take Tylenol 1000 mg (2 extra strength tabs) or 975 mg (3 regular strength tabs) every 6 hours as needed.  Stay physically active and keep the diet clean.  Heat (pad or rice pillow in microwave) over affected area, 10-15 minutes twice daily.   Yoga may be helpful for the back. Use good form when lifting.   Let us know if you need anything.

## 2018-02-05 NOTE — Progress Notes (Signed)
Chief Complaint  Patient presents with  . Fatigue    Subjective: Patient is a 27 y.o. female here for fatigue.  She is here with her husband.  Patient had a child around 3 months ago.  She has been fatigued since then.  Her thyroid medication has been decreased.  Since that time, she has noticed poor weight loss following birth fatigue.  She is not having cycles yet.  No other areas of easy bruising/bleeding.  Her diet is fair, she is not exercising outside of parenting.  For several weeks she has had a dry and scaly skin on her finger.  No new topicals.  She has tried over-the-counter Corazon cream without relief.  This has happened before in the past.  She also notes upper back pain since giving birth.  She does not do any stretching or exercises.  She has not tried anything for this at home.  She is currently breast-feeding.  No numbness, tingling, or weakness.   ROS: Endo: +fatigue MSK: +back pain  Past Medical History:  Diagnosis Date  . Hypothyroid     Objective: BP 112/68 (BP Location: Left Arm, Patient Position: Sitting, Cuff Size: Normal)   Pulse 66   Temp 98.1 F (36.7 C) (Oral)   Ht 5\' 2"  (1.575 m)   Wt 166 lb 2 oz (75.4 kg)   SpO2 98%   BMI 30.38 kg/m  General: Awake, appears stated age HEENT: MMM, EOMi I did not appreciate any nodules. Neck: Supple, no masses or asymmetry,  Heart: RRR, no murmurs Lungs: CTAB, no rales, wheezes or rhonchi. No accessory muscle use Skin: L 5th digit there is thickened skin with mild scaling, no erythema, excessive warmth, fluctuance, or tenderness MSK: +TTP over thoracic parasp msc b/l Neuro: DTR's equal and symmetric throughout Psych: Age appropriate judgment and insight, normal affect and mood  Assessment and Plan: Hypothyroidism, unspecified type - Plan: TSH, T4, free  Dermatitis - Plan: Clobetasol Prop Emollient Base (CLOBETASOL PROPIONATE E) 0.05 % emollient cream  Upper back pain  Fatigue, unspecified type - Plan:  CBC  Orders as above. Check thyroid and CBC. 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 Heat, Tylenol, stretching, yoga recommended. Follow-up in 6 weeks. The patient and her husband voiced understanding and agreement to the plan.  Jilda Rocheicholas Paul [place], DO 02/05/18  9:01 AM

## 2018-02-05 NOTE — Progress Notes (Signed)
Pre visit review using our clinic review tool, if applicable. No additional management support is needed unless otherwise documented below in the visit note. 

## 2018-05-12 ENCOUNTER — Ambulatory Visit (INDEPENDENT_AMBULATORY_CARE_PROVIDER_SITE_OTHER): Payer: BLUE CROSS/BLUE SHIELD | Admitting: Family Medicine

## 2018-05-12 ENCOUNTER — Encounter: Payer: Self-pay | Admitting: Family Medicine

## 2018-05-12 VITALS — BP 108/70 | HR 67 | Temp 97.8°F | Ht 62.0 in | Wt 164.1 lb

## 2018-05-12 DIAGNOSIS — E039 Hypothyroidism, unspecified: Secondary | ICD-10-CM

## 2018-05-12 DIAGNOSIS — Z23 Encounter for immunization: Secondary | ICD-10-CM

## 2018-05-12 LAB — T4, FREE: FREE T4: 0.73 ng/dL (ref 0.60–1.60)

## 2018-05-12 LAB — TSH: TSH: 3.94 u[IU]/mL (ref 0.35–4.50)

## 2018-05-12 MED ORDER — LEVOTHYROXINE SODIUM 25 MCG PO TABS: 37.5000 ug | ORAL_TABLET | Freq: Every day | ORAL | 2 refills | Status: DC

## 2018-05-12 NOTE — Progress Notes (Signed)
Pre visit review using our clinic review tool, if applicable. No additional management support is needed unless otherwise documented below in the visit note. 

## 2018-05-12 NOTE — Progress Notes (Signed)
Chief Complaint  Patient presents with  . Follow-up    check thyroid    Subjective: Patient is a 27 y.o. female here for hypothyroid f/u.  Patient presents for follow-up of hypothyroidism.  Reports compliance with medication. Current symptoms include: +fatigue Denies: weight gain, constipation, losing hair, feeling excessive energy, palpitations, sweating and weight loss She believes her dose should be unchanged   ROS: Heart: Denies chest pain  Lungs: Denies SOB   Past Medical History:  Diagnosis Date  . Hypothyroid     Objective: BP 108/70 (BP Location: Left Arm, Patient Position: Sitting, Cuff Size: Normal)   Pulse 67   Temp 97.8 F (36.6 C) (Oral)   Ht 5\' 2"  (1.575 m)   Wt 164 lb 2 oz (74.4 kg)   SpO2 98%   BMI 30.02 kg/m  General: Awake, appears stated age HEENT: MMM, EOMi Neck: No asymmetry, no nodules or thyromegaly appreciated Heart: RRR, no lower extremity edema Lungs: CTAB, no rales, wheezes or rhonchi. No accessory muscle use Abdomen: Soft, nontender, nondistended, bowel sounds present Psych: Age appropriate judgment and insight, normal affect and mood  Assessment and Plan: Hypothyroidism, unspecified type - Plan: levothyroxine (SYNTHROID) 25 MCG tablet, TSH, T4, free  Need for influenza vaccination - Plan: Flu Vaccine QUAD 6+ mos PF IM (Fluarix Quad PF)  Having some fatigue, interested in increasing dose to 50 mcg if free T4 is on the lower end of normal. Follow-up in 6 weeks if he make a dose change, 6 months if no change. The patient voiced understanding and agreement to the plan.  Jilda Rocheicholas Paul [place], DO 05/12/18  12:12 PM

## 2018-05-12 NOTE — Patient Instructions (Signed)
Give us 2-3 business days to get the results of your labs back.  Let us know if you need anything.  

## 2018-05-13 ENCOUNTER — Other Ambulatory Visit: Payer: Self-pay | Admitting: Family Medicine

## 2018-05-13 MED ORDER — LEVOTHYROXINE SODIUM 50 MCG PO TABS: 50.0000 ug | ORAL_TABLET | Freq: Every day | ORAL | 3 refills | Status: DC

## 2018-06-25 ENCOUNTER — Encounter: Payer: Self-pay | Admitting: Family Medicine

## 2018-06-25 ENCOUNTER — Ambulatory Visit (INDEPENDENT_AMBULATORY_CARE_PROVIDER_SITE_OTHER): Payer: BLUE CROSS/BLUE SHIELD | Admitting: Family Medicine

## 2018-06-25 VITALS — BP 108/64 | HR 89 | Temp 98.1°F | Ht 63.0 in | Wt 164.5 lb

## 2018-06-25 DIAGNOSIS — Z0000 Encounter for general adult medical examination without abnormal findings: Secondary | ICD-10-CM | POA: Diagnosis not present

## 2018-06-25 LAB — CBC
HCT: 40.2 % (ref 36.0–46.0)
HEMOGLOBIN: 13.5 g/dL (ref 12.0–15.0)
MCHC: 33.6 g/dL (ref 30.0–36.0)
MCV: 86.7 fl (ref 78.0–100.0)
Platelets: 239 10*3/uL (ref 150.0–400.0)
RBC: 4.64 Mil/uL (ref 3.87–5.11)
RDW: 13.6 % (ref 11.5–15.5)
WBC: 9.6 10*3/uL (ref 4.0–10.5)

## 2018-06-25 LAB — COMPREHENSIVE METABOLIC PANEL
ALK PHOS: 90 U/L (ref 39–117)
ALT: 19 U/L (ref 0–35)
AST: 17 U/L (ref 0–37)
Albumin: 4.1 g/dL (ref 3.5–5.2)
BUN: 12 mg/dL (ref 6–23)
CO2: 26 mEq/L (ref 19–32)
Calcium: 9.2 mg/dL (ref 8.4–10.5)
Chloride: 105 mEq/L (ref 96–112)
Creatinine, Ser: 0.54 mg/dL (ref 0.40–1.20)
GFR: 142.42 mL/min (ref >60.00)
GLUCOSE: 87 mg/dL (ref 70–99)
POTASSIUM: 4.2 meq/L (ref 3.5–5.1)
SODIUM: 137 meq/L (ref 135–145)
TOTAL PROTEIN: 8 g/dL (ref 6.0–8.3)
Total Bilirubin: 0.7 mg/dL (ref 0.2–1.2)

## 2018-06-25 LAB — LIPID PANEL
Cholesterol: 182 mg/dL (ref 0–200)
HDL: 38 mg/dL — AB (ref >39.00)
LDL Cholesterol: 125 mg/dL — ABNORMAL HIGH (ref 0–99)
NONHDL: 144.19
Total CHOL/HDL Ratio: 5
Triglycerides: 96 mg/dL (ref 0.0–149.0)
VLDL: 19.2 mg/dL (ref 0.0–40.0)

## 2018-06-25 NOTE — Patient Instructions (Addendum)
Wear your selt belt.  Give us 2-3 business days to get the results of your labs back.   Keep the diet clean and stay active.  Aim to do some physical exertion for 150 minutes per week. This is typically divided into 5 days per week, 30 minutes per day. The activity should be enough to get your heart rate up. Anything is better than nothing if you have time constraints.  Let us know if you need anything.  Healthy Eating Plan Many factors influence your heart health, including eating and exercise habits. Heart (coronary) risk increases with abnormal blood fat (lipid) levels. Heart-healthy meal planning includes limiting unhealthy fats, increasing healthy fats, and making other small dietary changes. This includes maintaining a healthy body weight to help keep lipid levels within a normal range.  WHAT IS MY PLAN?  Your health care provider recommends that you:  Drink a glass of water before meals to help with satiety.  Eat slowly.  An alternative to the water is to add Metamucil. This will help with satiety as well. It does contain calories, unlike water.  WHAT TYPES OF FAT SHOULD I CHOOSE?  Choose healthy fats more often. Choose monounsaturated and polyunsaturated fats, such as olive oil and canola oil, flaxseeds, walnuts, almonds, and seeds.  Eat more omega-3 fats. Good choices include salmon, mackerel, sardines, tuna, flaxseed oil, and ground flaxseeds. Aim to eat fish at least two times each week.  Avoid foods with partially hydrogenated oils in them. These contain trans fats. Examples of foods that contain trans fats are stick margarine, some tub margarines, cookies, crackers, and other baked goods. If you are going to avoid a fat, this is the one to avoid!  WHAT GENERAL GUIDELINES DO I NEED TO FOLLOW?  Check food labels carefully to identify foods with trans fats. Avoid these types of options when possible.  Fill one half of your plate with vegetables and green salads. Eat 4-5  servings of vegetables per day. A serving of vegetables equals 1 cup of raw leafy vegetables,  cup of raw or cooked cut-up vegetables, or  cup of vegetable juice.  Fill one fourth of your plate with whole grains. Look for the word "whole" as the first word in the ingredient list.  Fill one fourth of your plate with lean protein foods.  Eat 4-5 servings of fruit per day. A serving of fruit equals one medium whole fruit,  cup of dried fruit,  cup of fresh, frozen, or canned fruit. Try to avoid fruits in cups/syrups as the sugar content can be high.  Eat more foods that contain soluble fiber. Examples of foods that contain this type of fiber are apples, broccoli, carrots, beans, peas, and barley. Aim to get 20-30 g of fiber per day.  Eat more home-cooked food and less restaurant, buffet, and fast food.  Limit or avoid alcohol.  Limit foods that are high in starch and sugar.  Avoid fried foods when able.  Cook foods by using methods other than frying. Baking, boiling, grilling, and broiling are all great options. Other fat-reducing suggestions include: ? Removing the skin from poultry. ? Removing all visible fats from meats. ? Skimming the fat off of stews, soups, and gravies before serving them. ? Steaming vegetables in water or broth.  Lose weight if you are overweight. Losing just 5-10% of your initial body weight can help your overall health and prevent diseases such as diabetes and heart disease.  Increase your consumption of nuts, legumes,  and seeds to 4-5 servings per week. One serving of dried beans or legumes equals  cup after being cooked, one serving of nuts equals 1 ounces, and one serving of seeds equals  ounce or 1 tablespoon.  WHAT ARE GOOD FOODS CAN I EAT? Grains Grainy breads (try to find bread that is 3 g of fiber per slice or greater), oatmeal, light popcorn. Whole-grain cereals. Rice and pasta, including brown rice and those that are made with whole wheat. Edamame  pasta is a great alternative to grain pasta. It has a higher protein content. Try to avoid significant consumption of white bread, sugary cereals, or pastries/baked goods.  Vegetables All vegetables. Cooked white potatoes do not count as vegetables.  Fruits All fruits, but limit pineapple and bananas as these fruits have a higher sugar content.  Meats and Other Protein Sources Lean, well-trimmed beef, veal, pork, and lamb. Chicken and [place] without skin. All fish and shellfish. Wild duck, rabbit, pheasant, and venison. Egg whites or low-cholesterol egg substitutes. Dried beans, peas, lentils, and tofu.Seeds and most nuts.  Dairy Low-fat or nonfat cheeses, including ricotta, string, and mozzarella. Skim or 1% milk that is liquid, powdered, or evaporated. Buttermilk that is made with low-fat milk. Nonfat or low-fat yogurt. Soy/Almond milk are good alternatives if you cannot handle dairy.  Beverages Water is the best for you. Sports drinks with less sugar are more desirable unless you are a highly active athlete.  Sweets and Desserts Sherbets and fruit ices. Honey, jam, marmalade, jelly, and syrups. Dark chocolate.  Eat all sweets and desserts in moderation.  Fats and Oils Nonhydrogenated (trans-free) margarines. Vegetable oils, including soybean, sesame, sunflower, olive, peanut, safflower, corn, canola, and cottonseed. Salad dressings or mayonnaise that are made with a vegetable oil. Limit added fats and oils that you use for cooking, baking, salads, and as spreads.  Other Cocoa powder. Coffee and tea. Most condiments.  The items listed above may not be a complete list of recommended foods or beverages. Contact your dietitian for more options.

## 2018-06-25 NOTE — Progress Notes (Signed)
Chief Complaint  Patient presents with  . Annual Exam     Well Woman Amy Wagner is here for a complete physical.   Her last physical was >1 year ago.  Current diet: in general, a "healthy" diet. Current exercise: stretching, walking. Contraception? Yes No LMP recorded. (Menstrual status: IUD). Seatbelt? No  Health Maintenance Pap/HPV- Yes Tetanus- Yes HIV screening- Yes  Past Medical History:  Diagnosis Date  . Hypothyroid      Past Surgical History:  Procedure Laterality Date  . NO PAST SURGERIES      Medications  Current Outpatient Medications on File Prior to Visit  Medication Sig Dispense Refill  . Clobetasol Prop Emollient Base (CLOBETASOL PROPIONATE E) 0.05 % emollient cream Apply twice daily over finger for 10 days. 15 g 0  . ibuprofen (ADVIL,MOTRIN) 600 MG tablet Take 1 tablet (600 mg total) by mouth every 6 (six) hours. 30 tablet 0  . levothyroxine (SYNTHROID, LEVOTHROID) 50 MCG tablet Take 1 tablet (50 mcg total) by mouth daily. 30 tablet 3  . norethindrone (MICRONOR,CAMILA,ERRIN) 0.35 MG tablet Take 1 tablet (0.35 mg total) by mouth daily. 1 Package 11  . Prenatal Multivit-Min-Fe-FA (PRENATAL VITAMINS) 0.8 MG tablet Take 1 tablet by mouth daily. 30 tablet 12  . prenatal vitamin w/FE, FA (PRENATAL 1 + 1) 27-1 MG TABS tablet Take 1 tablet by mouth daily at 12 noon. 30 each 6   Allergies No Known Allergies  Review of Systems: Constitutional:  no unexpected weight changes Eye:  no recent significant change in vision Ear/Nose/Mouth/Throat:  Ears:  no tinnitus or vertigo and no recent change in hearing Nose/Mouth/Throat:  no complaints of nasal congestion, no sore throat Cardiovascular: no chest pain Respiratory:  no cough and no shortness of breath Gastrointestinal:  no abdominal pain, no change in bowel habits GU:  Female: negative for dysuria or pelvic pain Musculoskeletal/Extremities:  no pain of the joints Integumentary (Skin/Breast):  no  abnormal skin lesions reported Neurologic:  no headaches Endocrine:  denies fatigue Hematologic/Lymphatic:  No areas of easy bleeding  Exam BP 108/64 (BP Location: Left Arm, Patient Position: Sitting, Cuff Size: Normal)   Pulse 89   Temp 98.1 F (36.7 C) (Oral)   Ht 5\' 3"  (1.6 m)   Wt 164 lb 8 oz (74.6 kg)   SpO2 98%   BMI 29.14 kg/m  General:  well developed, well nourished, in no apparent distress Skin:  no significant moles, warts, or growths Head:  no masses, lesions, or tenderness Eyes:  pupils equal and round, sclera anicteric without injection Ears:  canals without lesions, TMs shiny without retraction, no obvious effusion, no erythema Nose:  nares patent, septum midline, mucosa normal, and no drainage or sinus tenderness Throat/Pharynx:  lips and gingiva without lesion; tongue and uvula midline; non-inflamed pharynx; no exudates or postnasal drainage Neck: neck supple without adenopathy, thyromegaly, or masses Lungs:  clear to auscultation, breath sounds equal bilaterally, no respiratory distress Cardio:  regular rate and rhythm, no bruits, no LE edema Abdomen:  abdomen soft, nontender; bowel sounds normal; no masses or organomegaly Genital: Defer to GYN Musculoskeletal:  symmetrical muscle groups noted without atrophy or deformity Extremities:  no clubbing, cyanosis, or edema, no deformities, no skin discoloration Neuro:  gait normal; deep tendon reflexes normal and symmetric Psych: well oriented with normal range of affect and appropriate judgment/insight  Assessment and Plan  Well adult exam - Plan: Comprehensive metabolic panel, CBC, Lipid panel   Well 27 y.o. female. Counseled on diet  and exercise. Healthy diet handout. Other orders as above. Follow up in 6 mo or prn. The patient voiced understanding and agreement to the plan.  Amy Rocheicholas Paul [place], DO 06/25/18 9:53 AM

## 2018-06-25 NOTE — Progress Notes (Signed)
Pre visit review using our clinic review tool, if applicable. No additional management support is needed unless otherwise documented below in the visit note. 

## 2018-07-10 ENCOUNTER — Other Ambulatory Visit: Payer: Self-pay | Admitting: Family Medicine

## 2018-07-10 DIAGNOSIS — L309 Dermatitis, unspecified: Secondary | ICD-10-CM

## 2018-10-12 ENCOUNTER — Other Ambulatory Visit: Payer: Self-pay | Admitting: Family Medicine

## 2019-01-10 IMAGING — US US MFM OB COMP +14 WKS
1 series · 14 of 28 positions shown · non-contrast
Comparison: none

[Series 1: us mfm ob comp +14 wks · 114 acquisitions, 14 frames shown]
[im 5/114]
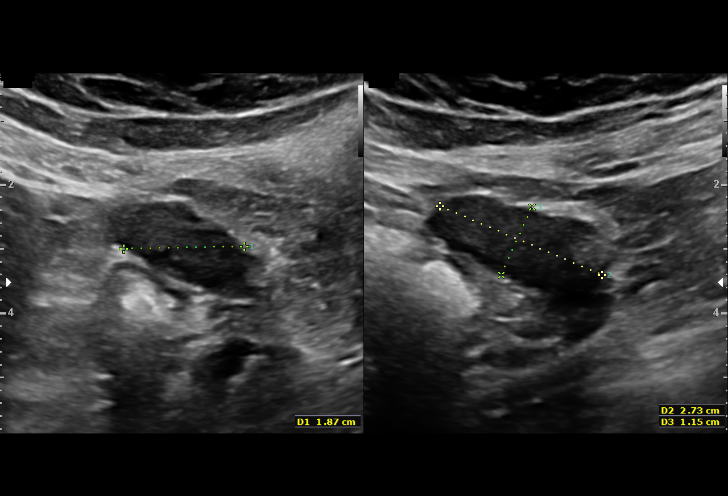
[im 13/114]
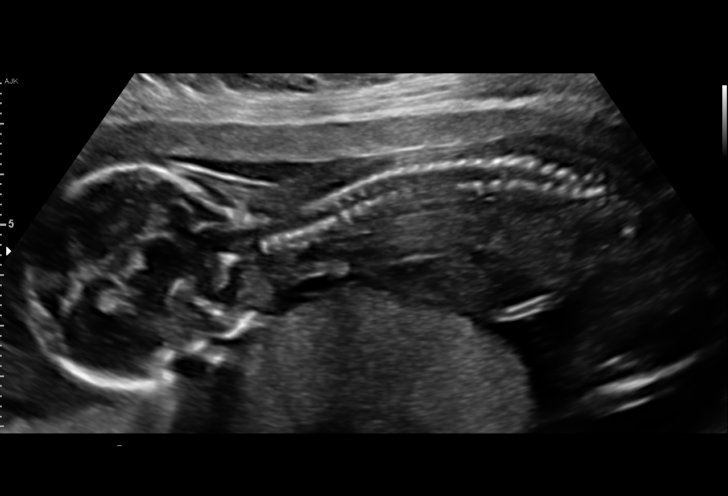
[im 21/114]
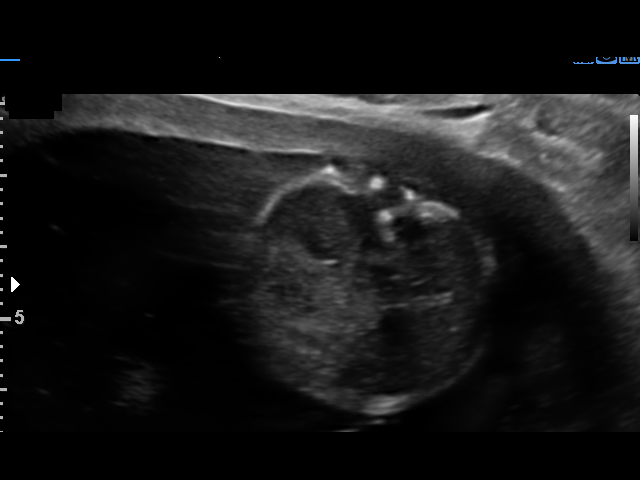
[im 30/114]
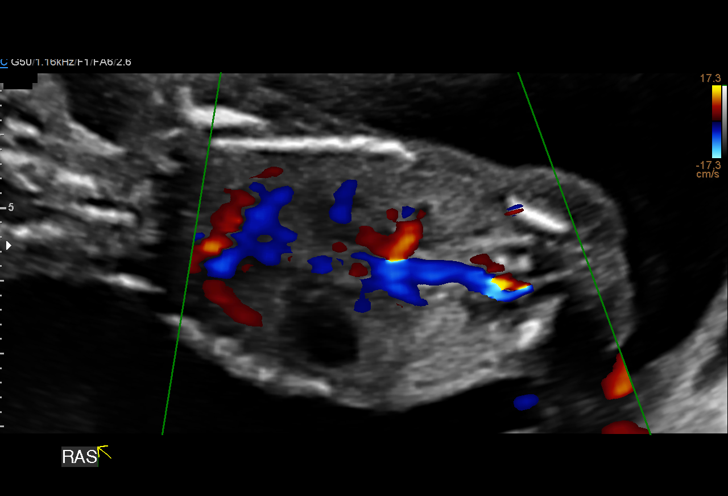
[im 38/114]
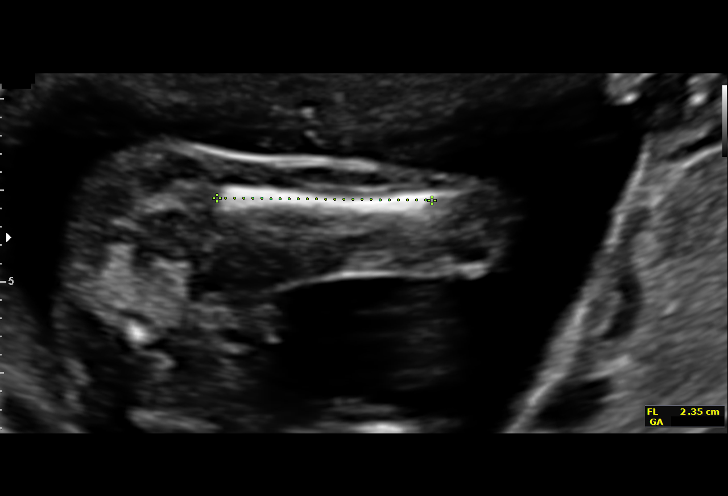
[im 47/114]
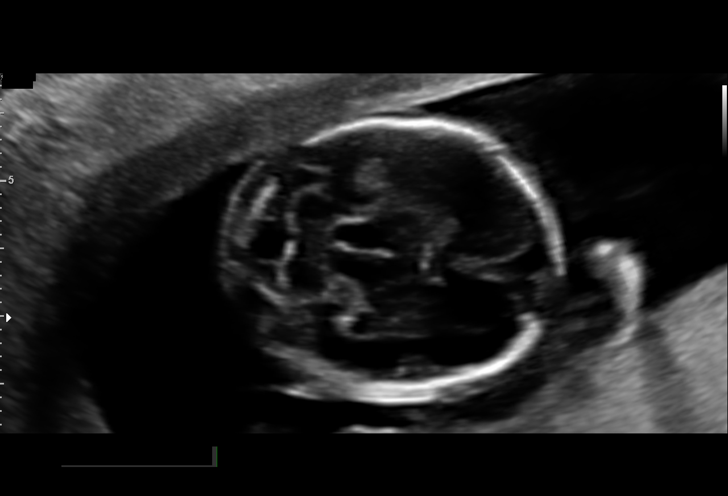
[im 55/114]
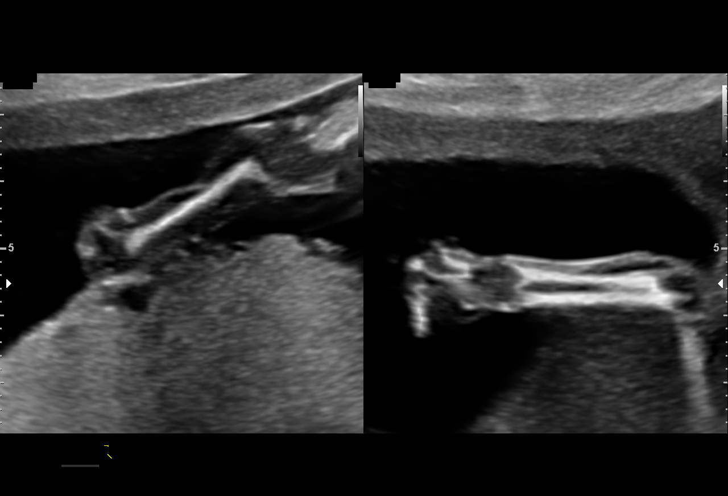
[im 63/114]
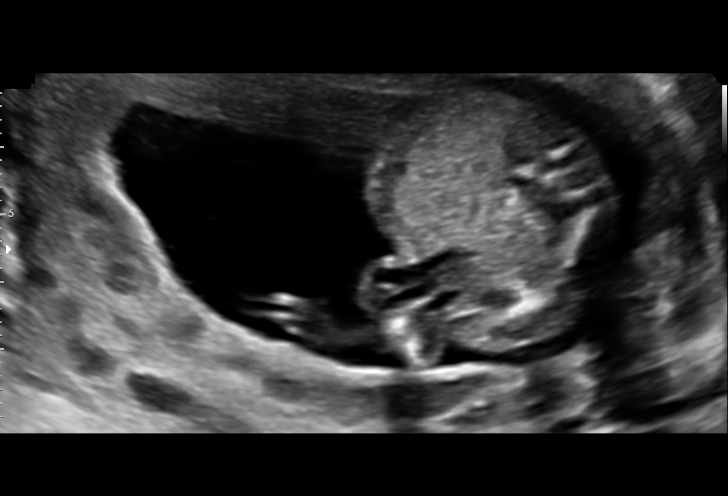
[im 72/114]
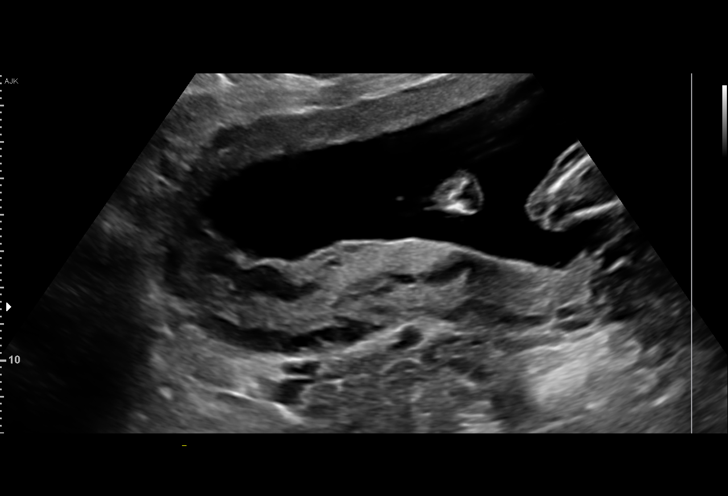
[im 80/114]
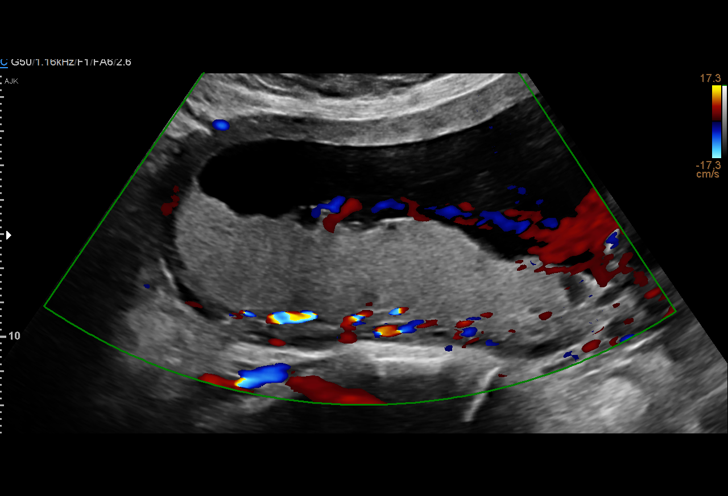
[im 88/114]
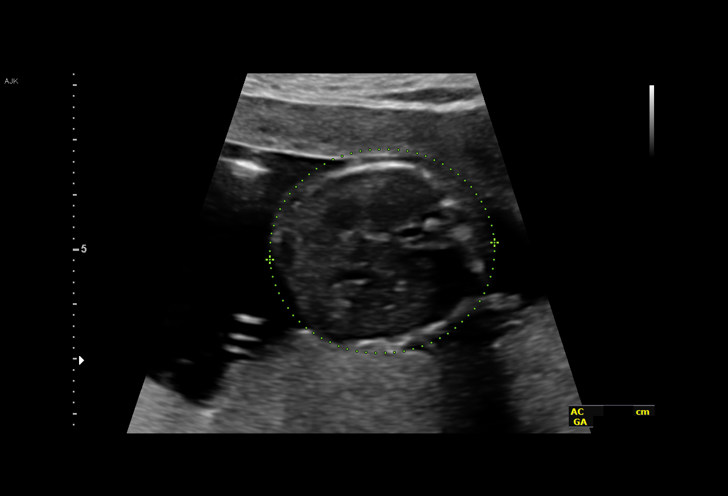
[im 97/114]
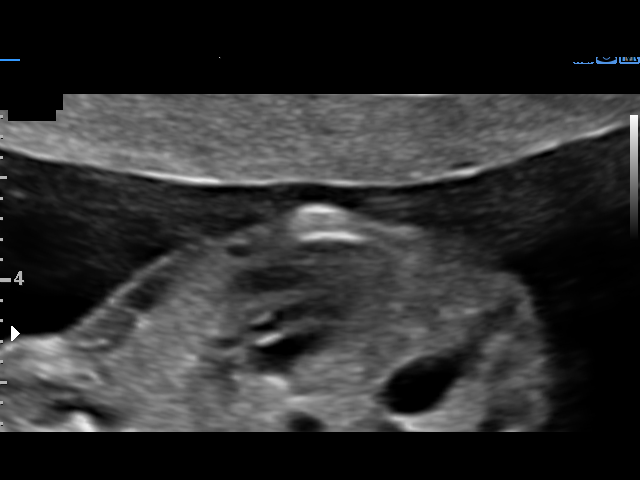
[im 105/114]
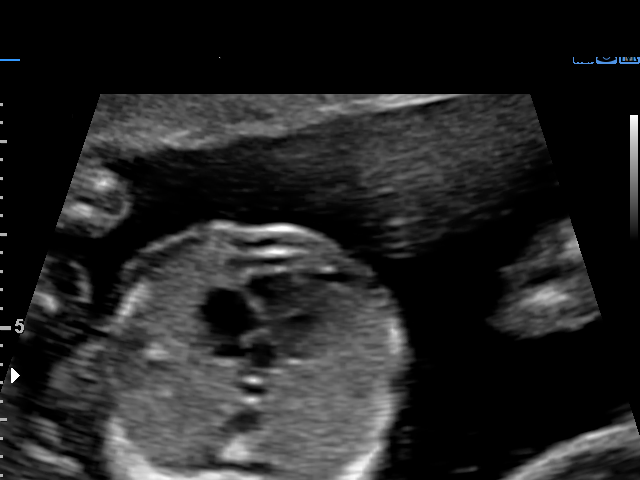
[im 114/114]
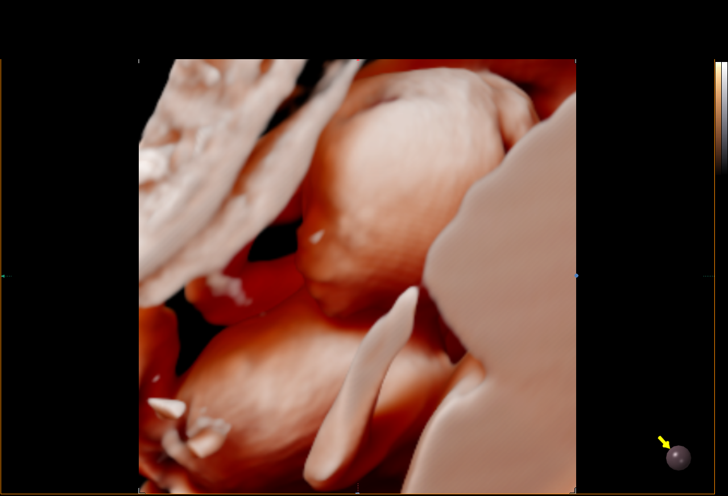

[14 of 28 positions shown; findings below may reference images not displayed]

TRAE CNM

1  VEIGAR MANGA          [id number]      [id number]     [id number]
Indications

17 weeks gestation of pregnancy
Hypothyroid on meds
Encounter for fetal anatomic survey
OB History

Gravidity:    2         Term:   1        Prem:   0        SAB:   0
TOP:          0       Ectopic:  0        Living: 1
Fetal Evaluation

Num Of Fetuses:     1
Fetal Heart         167
Rate(bpm):
Cardiac Activity:   Observed
Presentation:       Breech
Placenta:           Posterior, above cervical os
P. Cord Insertion:  Visualized, central

Amniotic Fluid
AFI FV:      Subjectively within normal limits

Largest Pocket(cm)
3.58
Biometry

BPD:      39.4  mm     G. Age:  18w 0d         63  %    CI:        77.48   %    70 - 86
FL/HC:      16.9   %    15.8 - 18
HC:      141.7  mm     G. Age:  17w 3d         29  %    HC/AC:      1.15        1.07 -
AC:      123.7  mm     G. Age:  18w 0d         58  %    FL/BPD:     60.7   %
FL:       23.9  mm     G. Age:  17w 1d         26  %    FL/AC:      19.3   %    20 - 24
CER:      17.6  mm     G. Age:  17w 4d         47  %
NFT:         4  mm
CM:        4.5  mm

Est. FW:     202  gm      0 lb 7 oz     49  %
Gestational Age

LMP:           18w 6d        Date:  11/28/16                 EDD:   09/04/17
U/S Today:     17w 5d                                        EDD:   09/12/17
Best:          17w 5d     Det. By:  U/S (04/09/17)           EDD:   09/12/17
Anatomy

Cranium:               Appears normal         Aortic Arch:            Appears normal
Cavum:                 Appears normal         Ductal Arch:            Appears normal
Ventricles:            Appears normal         Diaphragm:              Appears normal
Choroid Plexus:        Appears normal         Stomach:                Appears normal, left
sided
Cerebellum:            Appears normal         Abdomen:                Appears normal
Posterior Fossa:       Appears normal         Abdominal Wall:         Appears nml (cord
insert, abd wall)
Nuchal Fold:           Appears normal         Cord Vessels:           Appears normal (3
vessel cord)
Face:                  Appears normal         Kidneys:                Appear normal
(orbits and profile)
Lips:                  Appears normal         Bladder:                Appears normal
Thoracic:              Appears normal         Spine:                  Appears normal
Heart:                 Appears normal         Upper Extremities:      Appears normal
(4CH, axis, and
situs)
RVOT:                  Appears normal         Lower Extremities:      Appears normal
LVOT:                  Appears normal

Other:  Fetus appears to be a male. Nasal bone visualized. Heels and 5th
digit visualized. Technically difficult due to fetal position.
Cervix Uterus Adnexa

Cervix
Length:           3.43  cm.
Normal appearance by transabdominal scan.

Uterus
No abnormality visualized.

Left Ovary
Within normal limits.

Right Ovary
Within normal limits.

Adnexa:       No abnormality visualized.
Impression

SIUP at 17+5 weeks
Normal detailed fetal anatomy
Markers of aneuploidy: none
Normal amniotic fluid volume
EDC based on today's measurements: 09/12/17
(LMP approximate per [REDACTED])
Recommendations

Follow-up ultrasound for interval growth in 4 weeks (confirm
new EDC)

## 2019-10-15 ENCOUNTER — Other Ambulatory Visit: Payer: Self-pay | Admitting: Family Medicine

## 2019-10-21 ENCOUNTER — Encounter: Payer: Self-pay | Admitting: Family Medicine

## 2019-10-21 ENCOUNTER — Ambulatory Visit (INDEPENDENT_AMBULATORY_CARE_PROVIDER_SITE_OTHER): Payer: 59 | Admitting: Family Medicine

## 2019-10-21 ENCOUNTER — Other Ambulatory Visit: Payer: Self-pay

## 2019-10-21 VITALS — BP 102/62 | HR 68 | Temp 95.6°F | Ht 63.0 in | Wt 148.4 lb

## 2019-10-21 DIAGNOSIS — E039 Hypothyroidism, unspecified: Secondary | ICD-10-CM

## 2019-10-21 DIAGNOSIS — H5310 Unspecified subjective visual disturbances: Secondary | ICD-10-CM | POA: Diagnosis not present

## 2019-10-21 DIAGNOSIS — J302 Other seasonal allergic rhinitis: Secondary | ICD-10-CM

## 2019-10-21 LAB — T4, FREE: Free T4: 0.85 ng/dL (ref 0.60–1.60)

## 2019-10-21 LAB — TSH: TSH: 1.82 u[IU]/mL (ref 0.35–4.50)

## 2019-10-21 MED ORDER — LEVOCETIRIZINE DIHYDROCHLORIDE 5 MG PO TABS: 5.0000 mg | ORAL_TABLET | Freq: Every evening | ORAL | 2 refills | Status: DC

## 2019-10-21 MED ORDER — LEVOTHYROXINE SODIUM 50 MCG PO TABS: 50.0000 ug | ORAL_TABLET | Freq: Every day | ORAL | 2 refills | Status: DC

## 2019-10-21 NOTE — Patient Instructions (Addendum)
Give [place] 2-3 business days to get the results of your labs back.   Call Center for [facility] at [facility] at [phone] for an appointment.  They are located at [address], [place], [place], [address] (right across the hall from our office).   If you do not hear anything about your referral in the next 1-2 weeks, call our office and ask for an update.  Claritin (loratadine), Allegra (fexofenadine), Zyrtec (cetirizine) which is also equivalent to Xyzal (levocetirizine); these are listed in order from weakest to strongest. Generic, and therefore cheaper, options are in the parentheses.   Flonase (fluticasone); nasal spray that is over the counter. 2 sprays each nostril, once daily. Aim towards the same side eye when you spray.  There are available OTC, and the generic versions, which may be cheaper, are in parentheses. Show this to a pharmacist if you have trouble finding any of these items.  Let us know if you need anything.

## 2019-10-21 NOTE — Progress Notes (Signed)
Chief Complaint  Patient presents with  . Follow-up    thyroid    Subjective: Patient is a 29 y.o. female here for fatigue. Here w spouse who helps interpret.   Hypothyroidism Patient presents for follow-up of hypothyroidism.  Reports compliance with medication. Current symptoms include: denies fatigue, weight changes, heat/cold intolerance, bowel/skin changes or CVS symptoms She believes her dose should be unchanged  +red eyes intermittently that also itch. She is not taking anything for allergies. Not just pollen as a trigger. Would like to see eye specialist as she thinks she is losing sight of small/fine things.   Past Medical History:  Diagnosis Date  . Hypothyroid     Objective: BP 102/62 (BP Location: Left Arm, Patient Position: Sitting, Cuff Size: Normal)   Pulse 68   Temp (!) 95.6 F (35.3 C) (Temporal)   Ht 5\' 3"  (1.6 m)   Wt 148 lb 6 oz (67.3 kg)   SpO2 99%   BMI 26.28 kg/m  General: Awake, appears stated age HEENT: MMM, EOMi Heart: RRR, no LE edema Neck: Supple, symmetric, no thyromegaly or nodules noted Lungs: CTAB, no rales, wheezes or rhonchi. No accessory muscle use Psych: Age appropriate judgment and insight, normal affect and mood  Assessment and Plan: Hypothyroidism, unspecified type - Plan: TSH, T4, free, levothyroxine (EUTHYROX) 50 MCG tablet  Seasonal allergies - Plan: levocetirizine (XYZAL) 5 MG tablet  Eye strain - Plan: Ambulatory referral to Ophthalmology  Orders as above. Start PO antihistamine.  Refer ophtho.  F/u in 6 mo for CPE or prn.  The patient and her spouse voiced understanding and agreement to the plan.  [place], DO 10/21/19  10:38 AM

## 2020-07-17 ENCOUNTER — Other Ambulatory Visit: Payer: Self-pay | Admitting: Family Medicine

## 2020-07-17 DIAGNOSIS — E039 Hypothyroidism, unspecified: Secondary | ICD-10-CM

## 2020-07-19 NOTE — Telephone Encounter (Signed)
Opened in error

## 2020-08-14 ENCOUNTER — Other Ambulatory Visit: Payer: Self-pay | Admitting: Family Medicine

## 2020-08-14 ENCOUNTER — Ambulatory Visit (INDEPENDENT_AMBULATORY_CARE_PROVIDER_SITE_OTHER): Payer: 59 | Admitting: Family Medicine

## 2020-08-14 ENCOUNTER — Other Ambulatory Visit: Payer: Self-pay

## 2020-08-14 ENCOUNTER — Encounter: Payer: Self-pay | Admitting: Family Medicine

## 2020-08-14 VITALS — BP 120/80 | HR 67 | Temp 98.1°F | Ht 62.0 in | Wt 159.5 lb

## 2020-08-14 DIAGNOSIS — E039 Hypothyroidism, unspecified: Secondary | ICD-10-CM

## 2020-08-14 DIAGNOSIS — Z0000 Encounter for general adult medical examination without abnormal findings: Secondary | ICD-10-CM

## 2020-08-14 DIAGNOSIS — N939 Abnormal uterine and vaginal bleeding, unspecified: Secondary | ICD-10-CM | POA: Diagnosis not present

## 2020-08-14 DIAGNOSIS — Z1159 Encounter for screening for other viral diseases: Secondary | ICD-10-CM | POA: Diagnosis not present

## 2020-08-14 DIAGNOSIS — Z1331 Encounter for screening for depression: Secondary | ICD-10-CM

## 2020-08-14 LAB — CBC
HCT: 41 % (ref 36.0–46.0)
Hemoglobin: 13.7 g/dL (ref 12.0–15.0)
MCHC: 33.6 g/dL (ref 30.0–36.0)
MCV: 83.6 fl (ref 78.0–100.0)
Platelets: 231 10*3/uL (ref 150.0–400.0)
RBC: 4.9 Mil/uL (ref 3.87–5.11)
RDW: 14 % (ref 11.5–15.5)
WBC: 9 10*3/uL (ref 4.0–10.5)

## 2020-08-14 LAB — TSH: TSH: 1.93 u[IU]/mL (ref 0.35–4.50)

## 2020-08-14 LAB — COMPREHENSIVE METABOLIC PANEL
ALT: 14 U/L (ref 0–35)
AST: 12 U/L (ref 0–37)
Albumin: 4.5 g/dL (ref 3.5–5.2)
Alkaline Phosphatase: 71 U/L (ref 39–117)
BUN: 9 mg/dL (ref 6–23)
CO2: 29 mEq/L (ref 19–32)
Calcium: 10.1 mg/dL (ref 8.4–10.5)
Chloride: 101 mEq/L (ref 96–112)
Creatinine, Ser: 0.57 mg/dL (ref 0.40–1.20)
GFR: 121.81 mL/min (ref >60.00)
Glucose, Bld: 81 mg/dL (ref 70–99)
Potassium: 3.8 mEq/L (ref 3.5–5.1)
Sodium: 135 mEq/L (ref 135–145)
Total Bilirubin: 0.8 mg/dL (ref 0.2–1.2)
Total Protein: 9.1 g/dL — ABNORMAL HIGH (ref 6.0–8.3)

## 2020-08-14 LAB — T4, FREE: Free T4: 0.92 ng/dL (ref 0.60–1.60)

## 2020-08-14 LAB — LIPID PANEL
Cholesterol: 184 mg/dL (ref 0–200)
HDL: 39.3 mg/dL (ref >39.00)
LDL Cholesterol: 121 mg/dL — ABNORMAL HIGH (ref 0–99)
NonHDL: 144.68
Total CHOL/HDL Ratio: 5
Triglycerides: 116 mg/dL (ref 0.0–149.0)
VLDL: 23.2 mg/dL (ref 0.0–40.0)

## 2020-08-14 LAB — HCG, QUANTITATIVE, PREGNANCY: Quantitative HCG: <0.6 m[IU]/mL

## 2020-08-14 MED ORDER — NORETHINDRONE ACETATE 5 MG PO TABS: 5.0000 mg | ORAL_TABLET | Freq: Every day | ORAL | 0 refills | Status: DC

## 2020-08-14 NOTE — Progress Notes (Signed)
Chief Complaint  Patient presents with  . Follow-up     Well Woman Amy Wagner is here for a complete physical.  Here w spouse.  Her last physical was >1 year ago.  Current diet: in general, a "healthy" diet. Current exercise: active at work. Fatigue out of ordinary? No Seatbelt? Yes Loss of interested in doing things or depression in past 2 weeks? No  Health Maintenance Pap/HPV- Yes Tetanus- Yes HIV screening- Yes Hep C screening- No   Over the last couple cycles, she has had normal menses, gone a couple days and starts bleeding again. She does have a hx of hypothyroid, but reports compliance w medication. Took a preg test several times that is neg. She is not on any hormonal tx. No pain, fevers, urinary complaints.   Past Medical History:  Diagnosis Date  . Hypothyroid      Past Surgical History:  Procedure Laterality Date  . NO PAST SURGERIES      Medications  Current Outpatient Medications on File Prior to Visit  Medication Sig Dispense Refill  . levothyroxine (SYNTHROID) 50 MCG tablet Take 1 tablet (50 mcg total) by mouth daily before breakfast. 30 tablet 0   Allergies No Known Allergies  Review of Systems: Constitutional:  no unexpected weight changes Eye:  no recent significant change in vision Ear/Nose/Mouth/Throat:  Ears:  no tinnitus or vertigo and no recent change in hearing Nose/Mouth/Throat:  no complaints of nasal congestion, no sore throat Cardiovascular: no chest pain Respiratory:  no cough and no shortness of breath Gastrointestinal: +intermittent constipation GU:  Female: negative for dysuria or pelvic pain Musculoskeletal/Extremities:  no pain of the joints Integumentary (Skin/Breast):  no abnormal skin lesions reported Neurologic:  no headaches Endocrine:  denies fatigue Hematologic/Lymphatic:  No areas of easy bleeding  Exam BP 120/80 (BP Location: Left Arm, Patient Position: Sitting, Cuff Size: Normal)   Pulse 67   Temp 98.1  F (36.7 C) (Oral)   Ht 5\' 2"  (1.575 m)   Wt 159 lb 8 oz (72.3 kg)   SpO2 99%   BMI 29.17 kg/m  General:  well developed, well nourished, in no apparent distress Skin:  no significant moles, warts, or growths Head:  no masses, lesions, or tenderness Eyes:  pupils equal and round, sclera anicteric without injection Ears:  canals without lesions, TMs shiny without retraction, no obvious effusion, no erythema Nose:  nares patent, septum midline, mucosa normal, and no drainage or sinus tenderness Throat/Pharynx:  lips and gingiva without lesion; tongue and uvula midline; non-inflamed pharynx; no exudates or postnasal drainage Neck: neck supple without adenopathy, thyromegaly, or masses Lungs:  clear to auscultation, breath sounds equal bilaterally, no respiratory distress Cardio:  regular rate and rhythm, no bruits, no LE edema Abdomen:  abdomen soft, nontender; bowel sounds normal; no masses or organomegaly Genital: Defer to GYN Musculoskeletal:  symmetrical muscle groups noted without atrophy or deformity Extremities:  no clubbing, cyanosis, or edema, no deformities, no skin discoloration Neuro:  gait normal; deep tendon reflexes normal and symmetric Psych: well oriented with normal range of affect and appropriate judgment/insight  Assessment and Plan  Well adult exam - Plan: Comprehensive metabolic panel, Lipid panel  Hypothyroidism, unspecified type - Plan: TSH, T4, free  Encounter for hepatitis C screening test for low risk patient  Depression screening negative  Abnormal uterine bleeding (AUB) - Plan: norethindrone (AYGESTIN) 5 MG tablet, CBC, hCG, quantitative, pregnancy, CANCELED: hCG, quantitative, pregnancy   Well 30 y.o. female. Counseled on diet and  exercise. Other orders as above. 5 d progesterone, hopefully this helps, she will sched f/u with Gyn. Will ck labs.  Follow up in 6 mo. The patient and her spouse voiced understanding and agreement to the plan.  Jilda Roche [place], DO 08/14/20 12:15 PM

## 2020-08-14 NOTE — Patient Instructions (Signed)
Give [place] 2-3 business days to get the results of your labs back.   Keep the diet clean and stay active.  Reach out to your gynecologist.  Let us know if you need anything.

## 2020-08-15 ENCOUNTER — Other Ambulatory Visit: Payer: Self-pay | Admitting: Family Medicine

## 2020-08-15 DIAGNOSIS — E039 Hypothyroidism, unspecified: Secondary | ICD-10-CM

## 2020-08-15 MED ORDER — LEVOTHYROXINE SODIUM 50 MCG PO TABS: 50.0000 ug | ORAL_TABLET | Freq: Every day | ORAL | 2 refills | Status: DC

## 2021-02-19 ENCOUNTER — Other Ambulatory Visit: Payer: Self-pay

## 2021-02-19 ENCOUNTER — Encounter: Payer: Self-pay | Admitting: Family Medicine

## 2021-02-19 ENCOUNTER — Ambulatory Visit (INDEPENDENT_AMBULATORY_CARE_PROVIDER_SITE_OTHER): Payer: 59 | Admitting: Family Medicine

## 2021-02-19 VITALS — BP 102/68 | HR 79 | Temp 97.9°F | Ht 62.0 in | Wt 162.5 lb

## 2021-02-19 DIAGNOSIS — R1313 Dysphagia, pharyngeal phase: Secondary | ICD-10-CM | POA: Diagnosis not present

## 2021-02-19 DIAGNOSIS — E039 Hypothyroidism, unspecified: Secondary | ICD-10-CM | POA: Diagnosis not present

## 2021-02-19 LAB — TSH: TSH: 1.98 u[IU]/mL (ref 0.35–5.50)

## 2021-02-19 LAB — T4, FREE: Free T4: 0.76 ng/dL (ref 0.60–1.60)

## 2021-02-19 MED ORDER — LEVOTHYROXINE SODIUM 50 MCG PO TABS: 50.0000 ug | ORAL_TABLET | Freq: Every day | ORAL | 2 refills | Status: DC

## 2021-02-19 NOTE — Patient Instructions (Addendum)
Give [place] 2-3 business days to get the results of your labs back.   I recommend getting the flu shot in mid October. This suggestion would change if the CDC comes out with a different recommendation.   Someone will reach out regarding the ultrasound of the neck.   Let us know if you need anything.

## 2021-02-19 NOTE — Progress Notes (Signed)
Chief Complaint  Patient presents with   Follow-up    6 month     Subjective: Patient is a 30 y.o. female here for f/u. Here w spouse.   Hypothyroidism Patient presents for follow-up of hypothyroidism.  Reports compliance with medication- levothyroxine 50 mcg/d.  Feels that her neck has more pressure against it over the thyroid. Has been going on over past mo. Feels like there is an obstruction when she swallows. Has never had [place]. Current symptoms include: denies fatigue, weight changes, heat/cold intolerance, bowel/skin changes or CVS symptoms She believes her dose should be unchanged  Past Medical History:  Diagnosis Date   Hypothyroid     Objective: BP 102/68   Pulse 79   Temp 97.9 F (36.6 C) (Oral)   Ht 5\' 2"  (1.575 m)   Wt 162 lb 8 oz (73.7 kg)   SpO2 99%   BMI 29.72 kg/m  General: Awake, appears stated age HEENT: MMM, EOMi Neck: Thyroid palpable. No nodules noted or ttp. No bruits.  Heart: RRR Neuro: Patellar DTR's equal and symmetric w/o clonus Lungs: CTAB, no rales, wheezes or rhonchi. No accessory muscle use Psych: Age appropriate judgment and insight, normal affect and mood  Assessment and Plan: Hypothyroidism, unspecified type - Plan: Thyroid Peroxidase Antibodies (TPO) (REFL), TSH, T4, free, levothyroxine (SYNTHROID) 50 MCG tablet  Pharyngeal dysphagia - Plan: THYROID  Chronic, stable. Ck above.  New problem, uncertain prog. Will ck [place]. Would consider GI eval if [place] neg.  F/u in 6 mo for CPE or prn otherwise.  The patient and spouse voiced understanding and agreement to the plan.  [place], DO 02/19/21  10:22 AM

## 2021-02-20 LAB — THYROID PEROXIDASE ANTIBODIES (TPO) (REFL): Thyroperoxidase Ab SerPl-aCnc: 320 IU/mL — ABNORMAL HIGH (ref <9)

## 2021-02-26 ENCOUNTER — Ambulatory Visit (HOSPITAL_BASED_OUTPATIENT_CLINIC_OR_DEPARTMENT_OTHER): Admission: RE | Admit: 2021-02-26 | Payer: 59 | Source: Ambulatory Visit

## 2021-05-06 ENCOUNTER — Encounter: Payer: Self-pay | Admitting: Family Medicine

## 2021-05-06 ENCOUNTER — Ambulatory Visit (INDEPENDENT_AMBULATORY_CARE_PROVIDER_SITE_OTHER): Payer: 59 | Admitting: Family Medicine

## 2021-05-06 ENCOUNTER — Other Ambulatory Visit: Payer: Self-pay

## 2021-05-06 VITALS — BP 108/62 | HR 92 | Temp 98.6°F | Ht 62.0 in | Wt 161.2 lb

## 2021-05-06 DIAGNOSIS — E039 Hypothyroidism, unspecified: Secondary | ICD-10-CM | POA: Diagnosis not present

## 2021-05-06 DIAGNOSIS — K118 Other diseases of salivary glands: Secondary | ICD-10-CM

## 2021-05-06 LAB — CBC
HCT: 41.4 % (ref 36.0–46.0)
Hemoglobin: 13.6 g/dL (ref 12.0–15.0)
MCHC: 32.9 g/dL (ref 30.0–36.0)
MCV: 83.9 fl (ref 78.0–100.0)
Platelets: 233 10*3/uL (ref 150.0–400.0)
RBC: 4.93 Mil/uL (ref 3.87–5.11)
RDW: 13.5 % (ref 11.5–15.5)
WBC: 9.4 10*3/uL (ref 4.0–10.5)

## 2021-05-06 MED ORDER — PREDNISONE 20 MG PO TABS: 40.0000 mg | ORAL_TABLET | Freq: Every day | ORAL | 0 refills | Status: AC

## 2021-05-06 MED ORDER — LEVOTHYROXINE SODIUM 50 MCG PO TABS: 50.0000 ug | ORAL_TABLET | Freq: Every day | ORAL | 2 refills | Status: AC

## 2021-05-06 NOTE — Progress Notes (Addendum)
Chief Complaint  Patient presents with   Thyroid Problem    Amy Wagner is a 30 y.o. female here for a skin complaint. Here w spouse who helps provide hx.   Duration: several days Location: under chin and behind L jaw Pruritic? No Painful? No Drainage? No New soaps/lotions/topicals/detergents? No Sick contacts? No Other associated symptoms: no fevers, wt loss, redness, night sweats Therapies tried thus far: none  Past Medical History:  Diagnosis Date   Hypothyroid     BP 108/62   Pulse 92   Temp 98.6 F (37 C) (Oral)   Ht 5\' 2"  (1.575 m)   Wt 161 lb 4 oz (73.1 kg)   SpO2 99%   BMI 29.49 kg/m  Gen: awake, alert, appearing stated age Lungs: No accessory muscle use Skin: Under chin, ttp w fullness/edema and also under angle of mandible on L. No drainage, erythema, fluctuance, excoriation Psych: Age appropriate judgment and insight  Pain of submandibular gland - Plan: CBC, predniSONE (DELTASONE) 20 MG tablet  Hypothyroidism, unspecified type - Plan: levothyroxine (SYNTHROID) 50 MCG tablet  5 d pred burst for this new issue w uncertain prog. Ck CBC. Could consider empiric tx w Augmentin if no improvement vs CT neck w contrast.  F/u prn. The patient and her spouse voiced understanding and agreement to the plan.  [place], DO 05/06/21 12:23 PM

## 2021-05-06 NOTE — Patient Instructions (Signed)
[facility] at [facility] [place] at [phone] for an appointment.  They are located at [address], [address], [place], [place], [address] (right across the hall from our office).  Give us 2-3 business days to get the results of your labs back.   Keep the diet clean and stay active.  Let us know if you need anything. 

## 2021-05-09 ENCOUNTER — Ambulatory Visit (HOSPITAL_BASED_OUTPATIENT_CLINIC_OR_DEPARTMENT_OTHER)
Admission: RE | Admit: 2021-05-09 | Discharge: 2021-05-09 | Disposition: A | Discharge status: Home / Self Care | Payer: 59 | Source: Ambulatory Visit | Attending: Family Medicine | Admitting: Family Medicine

## 2021-05-09 ENCOUNTER — Other Ambulatory Visit: Payer: Self-pay

## 2021-05-09 DIAGNOSIS — R1313 Dysphagia, pharyngeal phase: Secondary | ICD-10-CM | POA: Insufficient documentation

## 2021-05-27 ENCOUNTER — Telehealth: Payer: Self-pay | Admitting: *Deleted

## 2021-05-27 NOTE — Telephone Encounter (Signed)
The patient husband took her to the ED on Friday night. Did chest xray/ekg and checked out all good. She still is coughing some but getting better. Fever is gone. Still taking tylenol every 6 hours if needed. Will call if need anything but she is doing much better now.

## 2021-05-27 NOTE — Telephone Encounter (Signed)
Caller Name Amy Wagner Relationship To Patient Spouse Return Phone Number [phone] (Primary) Chief Complaint Fever (non-urgent symptom) (greater than THREE MONTHS old) Reason for Call Symptomatic / Request for Health Information Initial Comment Caller states his wife has a fever since Wednesday night, 103.8, mucous, cough. Fever is not controlled. fever is 101.8 this morning. Translation No Nurse Assessment Nurse: Evern Core, RN, Karin Golden Date/Time (Eastern Time): 05/24/2021 9:57:23 AM Confirm and document reason for call. If symptomatic, describe symptoms. ---Caller states his wife has had a fever since Wednesday night 05/22/21. Her temp 103.8 orally. She also has cough, congestion and runny nose. Her temp today is 101.8 orally for which she has taken tylenol. They did not perform a home covid test.  Disp. Time Lamount Cohen Time) Disposition Final User 05/24/2021 10:07:12 AM See HCP within 4 Hours (or PCP triage) Yes Evern Core, RN, Karin Golden

## 2021-08-27 ENCOUNTER — Encounter: Payer: 59 | Admitting: Family Medicine

## 2023-02-09 IMAGING — US US THYROID
2 series · 14 of 25 positions shown · non-contrast
Comparison: None.

CLINICAL DATA: Hypothyroid.

EXAM:
THYROID ULTRASOUND
TECHNIQUE: Ultrasound examination of the thyroid gland and adjacent soft
tissues was performed.

[Series 1: us thyroid · 11 of 35 slices shown (1 of 2)]
[im 1/35]
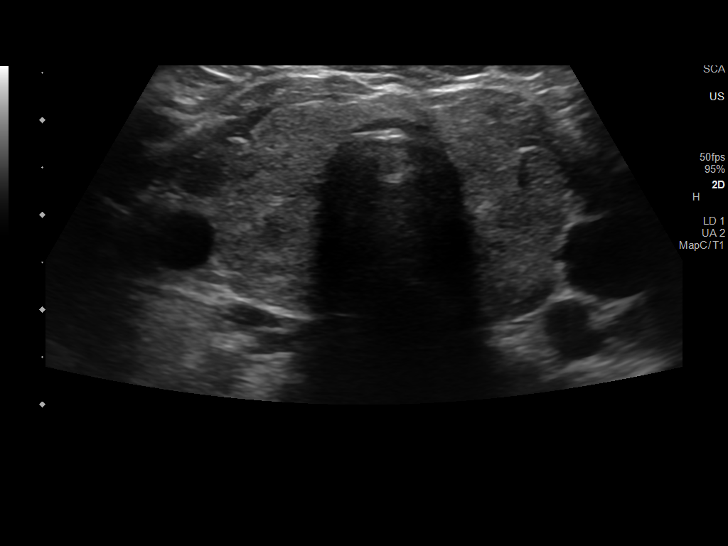
[im 4/35]
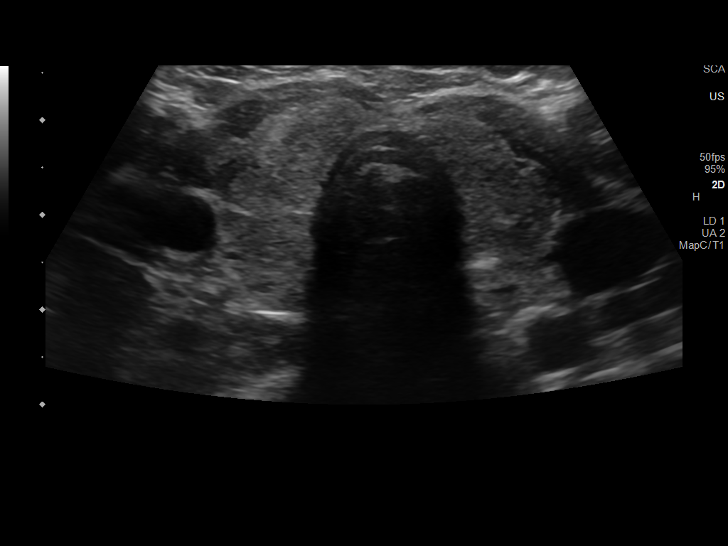
[im 8/35]
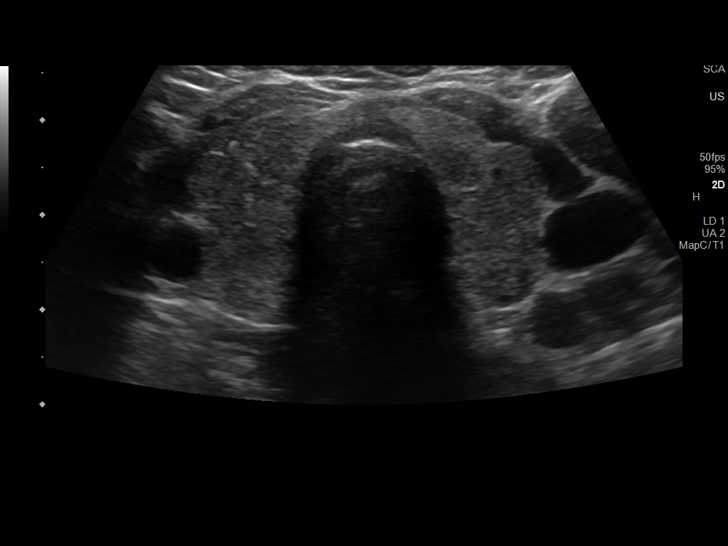
[im 12/35]
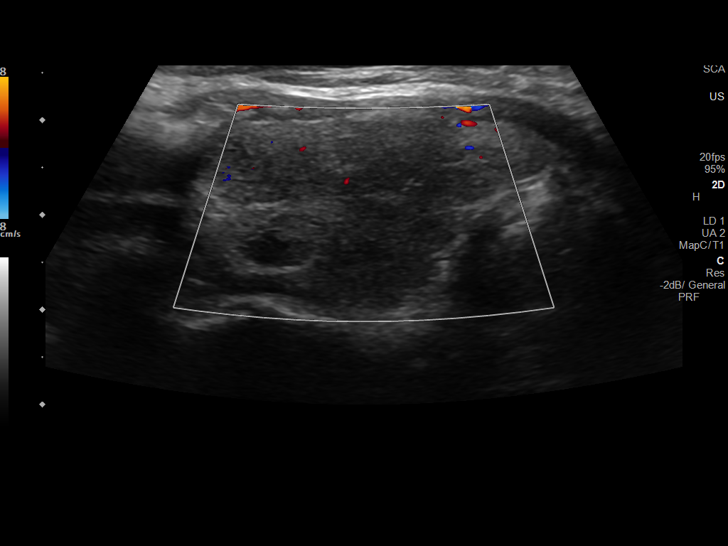
[im 16/35]
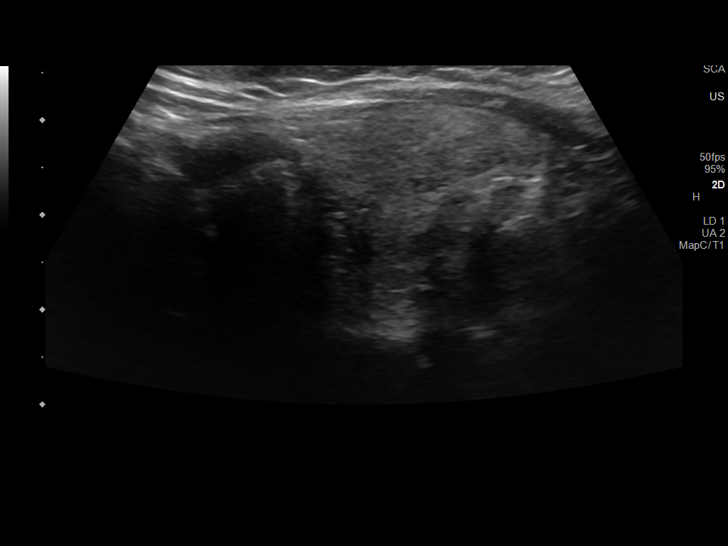
[im 18/35]
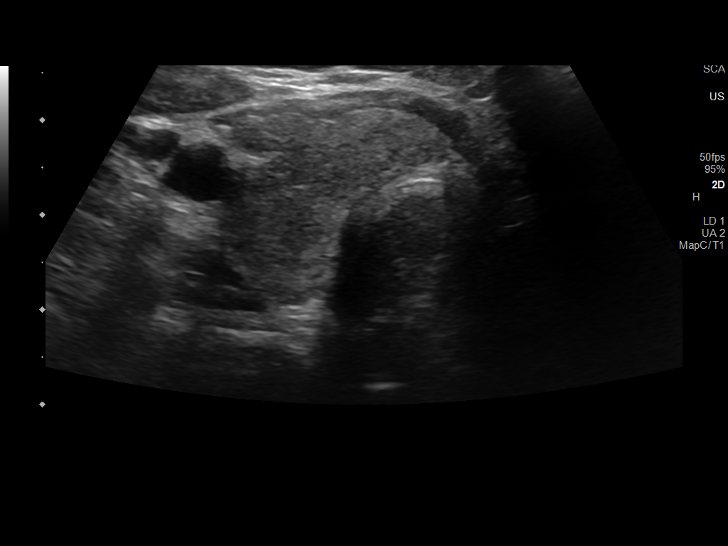
[im 21/35]
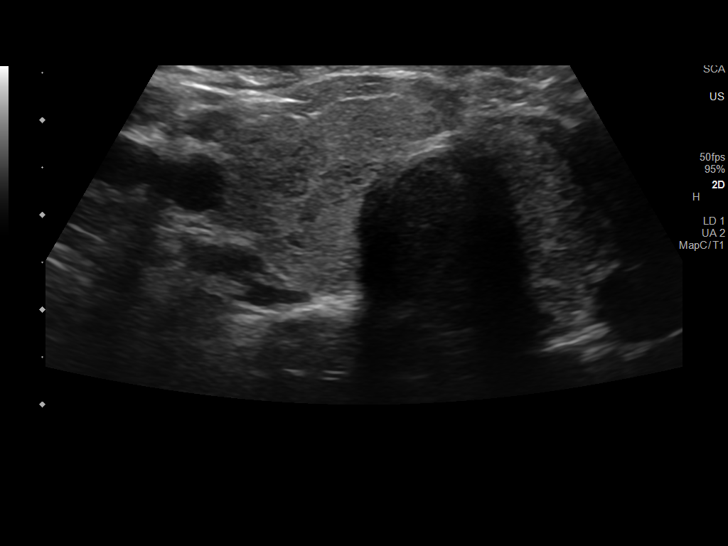
[im 25/35]
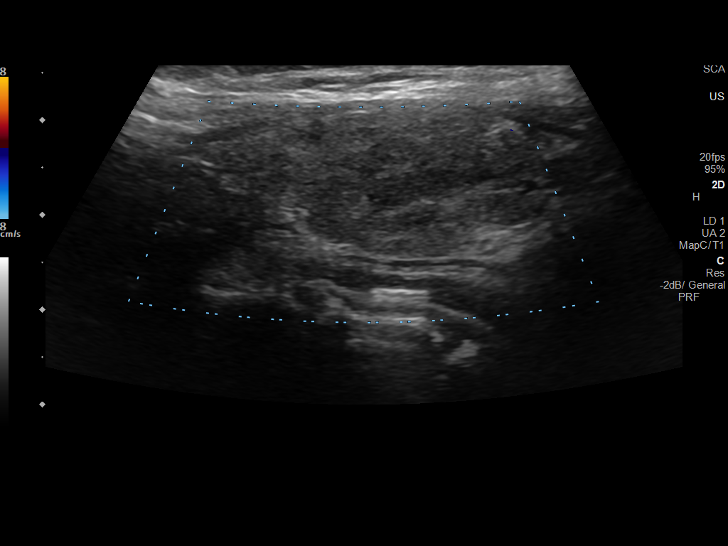
[im 29/35]
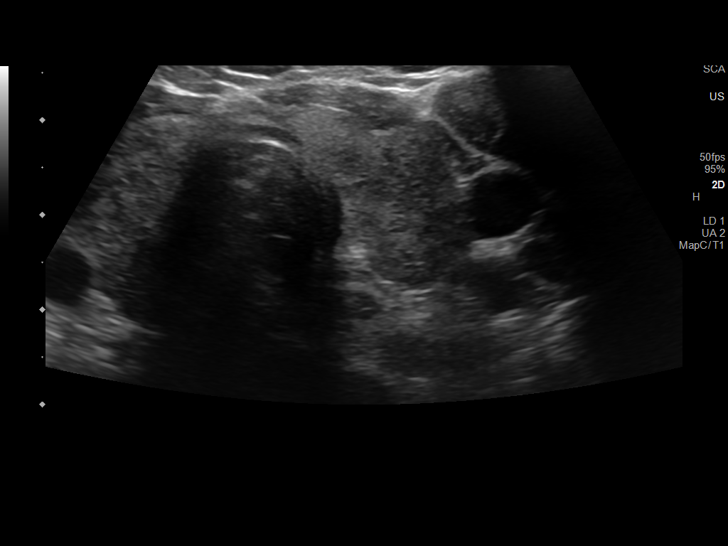
[im 31/35]
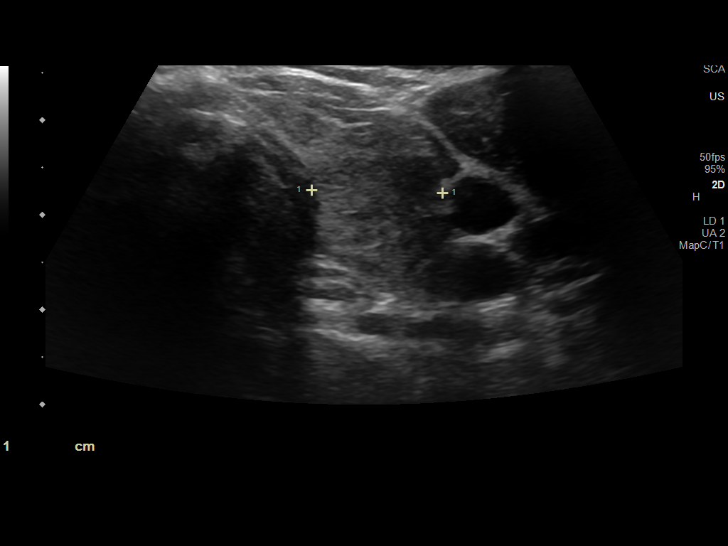
[im 35/35]
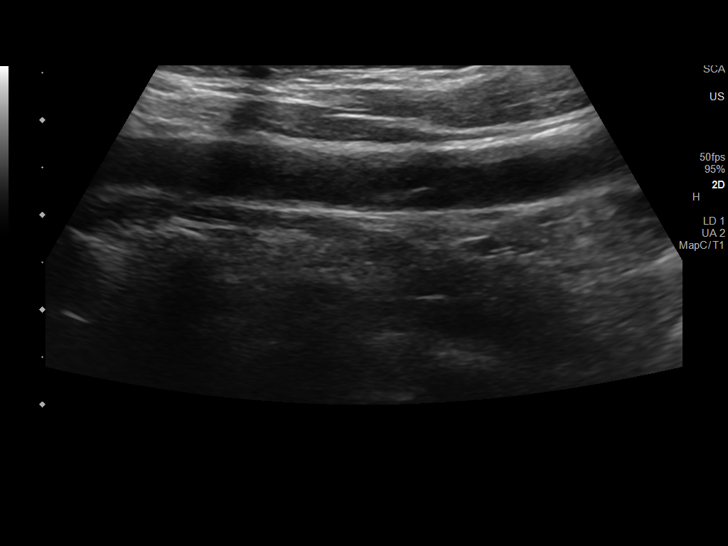

[Series 2: us thyroid · 3 of 11 slices shown (2 of 2)]
[im 3/11]
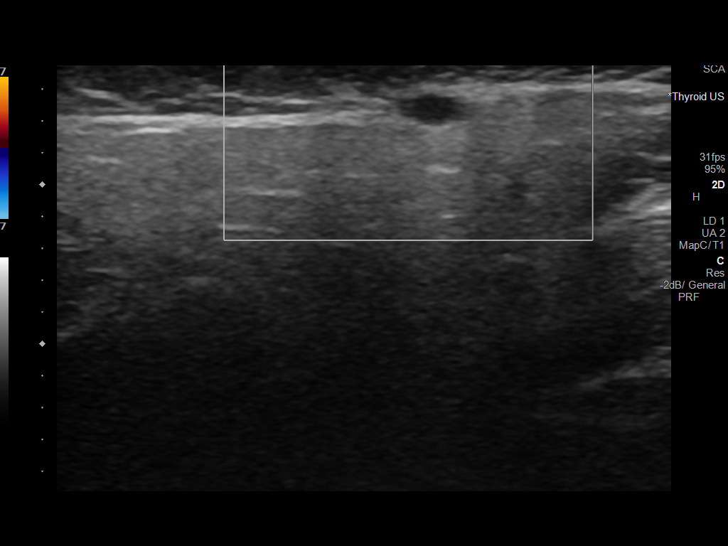
[im 7/11]
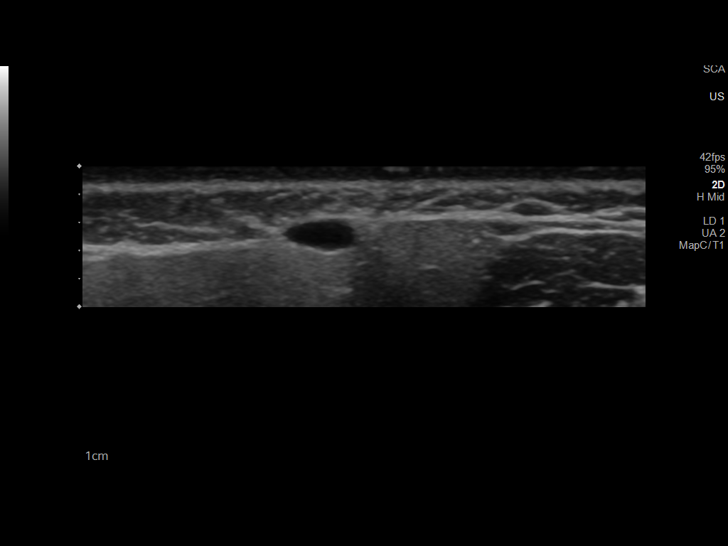
[im 11/11]
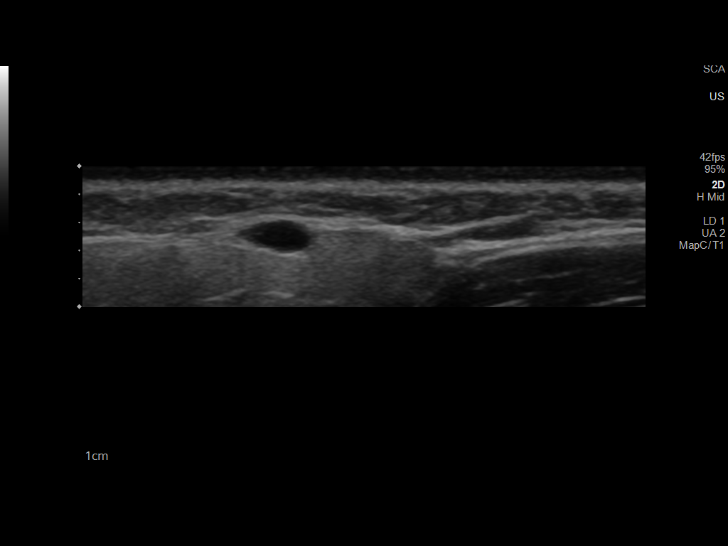

[14 of 25 positions shown; findings below may reference images not displayed]

FINDINGS: Parenchymal Echotexture: Moderately heterogenous

Isthmus: 0.4 cm

Right lobe: 3.9 x 2.1 x 1.3 cm

Left lobe: 4.2 x 1.7 x 1.4 cm

_________________________________________________________

Estimated total number of nodules >/= 1 cm: 0

Number of spongiform nodules >/=  2 cm not described below (TR1): 0

Number of mixed cystic and solid nodules >/= 1.5 cm not described
below (TR2): 0

_________________________________________________________

No discrete nodules are seen within the thyroid gland.

No cervical adenopathy within the imaged neck.
IMPRESSION: 1. Nonenlarged, heterogeneous thyroid gland.
2. No discrete nodule, therefore no recommended follow-up per
TI-RADS criteria. The above is in keeping with the ACR TI-RADS
recommendations - [HOSPITAL] 2458;[DATE].
